# Patient Record
Sex: Male | Born: 1935 | Race: White | Hispanic: No | Marital: Married | Smoking: Former smoker
Health system: Southern US, Community
[De-identification: ages and names within clinical notes are randomized; demographics above are authoritative.]

## PROBLEM LIST (undated history)

## (undated) DIAGNOSIS — I472 Ventricular tachycardia, unspecified: Secondary | ICD-10-CM

## (undated) DIAGNOSIS — Z9109 Other allergy status, other than to drugs and biological substances: Secondary | ICD-10-CM

## (undated) DIAGNOSIS — I4891 Unspecified atrial fibrillation: Secondary | ICD-10-CM

## (undated) DIAGNOSIS — I2510 Atherosclerotic heart disease of native coronary artery without angina pectoris: Secondary | ICD-10-CM

## (undated) DIAGNOSIS — N400 Enlarged prostate without lower urinary tract symptoms: Secondary | ICD-10-CM

## (undated) DIAGNOSIS — K219 Gastro-esophageal reflux disease without esophagitis: Secondary | ICD-10-CM

## (undated) DIAGNOSIS — I10 Essential (primary) hypertension: Secondary | ICD-10-CM

## (undated) DIAGNOSIS — E7800 Pure hypercholesterolemia, unspecified: Secondary | ICD-10-CM

## (undated) HISTORY — PX: APPENDECTOMY: SHX54

## (undated) HISTORY — PX: TONSILLECTOMY: SUR1361

---

## 2012-06-01 ENCOUNTER — Emergency Department (HOSPITAL_BASED_OUTPATIENT_CLINIC_OR_DEPARTMENT_OTHER): Payer: Medicare Other

## 2012-06-01 ENCOUNTER — Encounter (HOSPITAL_BASED_OUTPATIENT_CLINIC_OR_DEPARTMENT_OTHER): Payer: Self-pay | Admitting: *Deleted

## 2012-06-01 ENCOUNTER — Emergency Department (HOSPITAL_BASED_OUTPATIENT_CLINIC_OR_DEPARTMENT_OTHER)
Admission: EM | Admit: 2012-06-01 | Discharge: 2012-06-01 | Disposition: A | Discharge status: Other Acute Inpatient Hospital | Payer: Medicare Other | Attending: Emergency Medicine | Admitting: Emergency Medicine

## 2012-06-01 DIAGNOSIS — Z9109 Other allergy status, other than to drugs and biological substances: Secondary | ICD-10-CM | POA: Insufficient documentation

## 2012-06-01 DIAGNOSIS — Z862 Personal history of diseases of the blood and blood-forming organs and certain disorders involving the immune mechanism: Secondary | ICD-10-CM | POA: Insufficient documentation

## 2012-06-01 DIAGNOSIS — R509 Fever, unspecified: Secondary | ICD-10-CM | POA: Insufficient documentation

## 2012-06-01 DIAGNOSIS — Z8639 Personal history of other endocrine, nutritional and metabolic disease: Secondary | ICD-10-CM | POA: Insufficient documentation

## 2012-06-01 DIAGNOSIS — J189 Pneumonia, unspecified organism: Secondary | ICD-10-CM | POA: Insufficient documentation

## 2012-06-01 DIAGNOSIS — I10 Essential (primary) hypertension: Secondary | ICD-10-CM | POA: Insufficient documentation

## 2012-06-01 DIAGNOSIS — Z87891 Personal history of nicotine dependence: Secondary | ICD-10-CM | POA: Insufficient documentation

## 2012-06-01 HISTORY — DX: Other allergy status, other than to drugs and biological substances: Z91.09

## 2012-06-01 HISTORY — DX: Essential (primary) hypertension: I10

## 2012-06-01 HISTORY — DX: Pure hypercholesterolemia, unspecified: E78.00

## 2012-06-01 LAB — CBC WITH DIFFERENTIAL/PLATELET
Eosinophils Relative: 0 % (ref 0–5)
HCT: 47.9 % (ref 39.0–52.0)
Lymphocytes Relative: 15 % (ref 12–46)
Lymphs Abs: 1.9 10*3/uL (ref 0.7–4.0)
MCH: 31.8 pg (ref 26.0–34.0)
MCV: 93 fL (ref 78.0–100.0)
Monocytes Absolute: 1.4 10*3/uL — ABNORMAL HIGH (ref 0.1–1.0)
RBC: 5.15 MIL/uL (ref 4.22–5.81)
WBC: 13.2 10*3/uL — ABNORMAL HIGH (ref 4.0–10.5)

## 2012-06-01 LAB — BASIC METABOLIC PANEL
BUN: 16 mg/dL (ref 6–23)
CO2: 24 mEq/L (ref 19–32)
Calcium: 9.3 mg/dL (ref 8.4–10.5)
Creatinine, Ser: 1.2 mg/dL (ref 0.50–1.35)
Glucose, Bld: 108 mg/dL — ABNORMAL HIGH (ref 70–99)

## 2012-06-01 LAB — POCT I-STAT 3, ART BLOOD GAS (G3+)
O2 Saturation: 90 %
Patient temperature: 37
pO2, Arterial: 53 mmHg — ABNORMAL LOW (ref 80.0–100.0)

## 2012-06-01 MED ORDER — LEVOFLOXACIN IN D5W 750 MG/150ML IV SOLN
750.0000 mg | Freq: Once | INTRAVENOUS | Status: AC
  Administered 2012-06-01: 750 mg via INTRAVENOUS
  Filled 2012-06-01 (×2): qty 150

## 2012-06-01 MED ORDER — ALBUTEROL SULFATE (5 MG/ML) 0.5% IN NEBU
2.5000 mg | INHALATION_SOLUTION | Freq: Once | RESPIRATORY_TRACT | Status: AC
  Administered 2012-06-01: 2.5 mg via RESPIRATORY_TRACT
  Filled 2012-06-01: qty 0.5

## 2012-06-01 NOTE — ED Provider Notes (Addendum)
History     CSN: [id number]  Arrival date & time 06/01/12  0610   First MD Initiated Contact with Patient 06/01/12 [phone]      Chief Complaint  Patient presents with  . Cough    (Consider location/radiation/quality/duration/timing/severity/associated sxs/prior treatment) HPI Comments: Patient comes to the ER for evaluation of cough. Patient reports that he has had progressively worsening cough for several weeks. Cough is productive of thick sputum. Last night he had some blood in his sputum. Patient reports fever and chills. He saw his Dr. for this and was placed on prednisone, is on his last pill but has not improved. He has not been on any antibiotics.  Patient is a 79 y.o. male presenting with cough.  Cough Associated symptoms: fever     Past Medical History  Diagnosis Date  . Environmental allergies   . High cholesterol   . Hypertension     Past Surgical History  Procedure Laterality Date  . Appendectomy      No family history on file.  History  Substance Use Topics  . Smoking status: Former Games developer  . Smokeless tobacco: Never Used  . Alcohol Use: No      Review of Systems  Constitutional: Positive for fever.  Respiratory: Positive for cough.   All other systems reviewed and are negative.    Allergies  Ivp dye; Pravastatin; and Zocor  Home Medications  No current outpatient prescriptions on file.  BP 132/80  Pulse 88  Temp(Src) 98.1 F (36.7 C) (Oral)  Resp 20  Wt 165 lb (74.844 kg)  SpO2 96%  Physical Exam  Constitutional: He is oriented to person, place, and time. He appears well-developed and well-nourished. No distress.  HENT:  Head: Normocephalic and atraumatic.  Right Ear: Hearing normal.  Nose: Nose normal.  Mouth/Throat: Oropharynx is clear and moist and mucous membranes are normal.  Eyes: Conjunctivae and EOM are normal. Pupils are equal, round, and reactive to light.  Neck: Normal range of motion. Neck supple.  Cardiovascular:  Normal rate, regular rhythm, S1 normal and S2 normal.  Exam reveals no gallop and no friction rub.   No murmur heard. Pulmonary/Chest: Effort normal. No respiratory distress. He has decreased breath sounds in the right middle field, the right lower field, the left middle field and the left lower field. He has wheezes in the right lower field and the left lower field. He has no rhonchi. He has no rales. He exhibits no tenderness.  Abdominal: Soft. Normal appearance and bowel sounds are normal. There is no hepatosplenomegaly. There is no tenderness. There is no rebound, no guarding, no tenderness at McBurney's point and negative Murphy's sign. No hernia.  Musculoskeletal: Normal range of motion.  Neurological: He is alert and oriented to person, place, and time. He has normal strength. No cranial nerve deficit or sensory deficit. Coordination normal. GCS eye subscore is 4. GCS verbal subscore is 5. GCS motor subscore is 6.  Skin: Skin is warm, dry and intact. No rash noted. No cyanosis.  Psychiatric: He has a normal mood and affect. His speech is normal and behavior is normal. Thought content normal.    ED Course  Procedures (including critical care time)  Labs Reviewed - No data to display Dg Chest 2 View  06/01/2012  *RADIOLOGY REPORT*  Clinical Data: Productive cough for 2 days; shortness of breath.  CHEST - 2 VIEW  Comparison: None.  Findings: The lungs are well-aerated.  Bibasilar airspace opacities, more prominent on the right,  raise concern for mild pneumonia.  Mild peribronchial thickening is seen.  There is no evidence of pleural effusion or pneumothorax.  The heart is normal in size; the mediastinal contour is within normal limits.  No acute osseous abnormalities are seen.  IMPRESSION: Bibasilar airspace opacities, more prominent on the right, raise concern for mild pneumonia.  Mild peribronchial thickening seen.   Original Report Authenticated By: Tonia Ghent, M.D.      Diagnosis:  Community Acquired Pneumonia    MDM  Patient has been sick for several weeks with cough. He reports that his symptoms have been progressively worsening. This morning he was more short of breath and he has been in the past so he came to the ER. Patient reports productive cough with thick sputum occasionally bloody. This and has significantly diminished breath sounds at all lung fields, some wheezing at the bases. Oxygenation, however, is 96% on room air with normal respiration rate. Patient's vital signs are all entirely normal. Chest x-ray was performed. Findings consistent with community acquired pneumonia.  Patient administered IV Levaquin. He was given an albuterol nebulizer treatment after which time his oxygen saturation dropped to 90-91%. The patient's oxygenation has not improved. He is requiring supplemental oxygen. He has not any significant respiratory distress, however. Patient will require inpatient hospitalization for further treatment of bilateral community acquired pneumonia. Patient wishes to be admitted to [facility].      Gilda Crease, MD 06/01/12 1610  Gilda Crease, MD 06/01/12 [phone]

## 2012-06-01 NOTE — ED Notes (Signed)
rn supervisor calls back to state that pt now has a room, #756, and that their transport truck is in [place]. Melissa, unit clerk will call [facility] to request transport. Pt and family updated on plan of care.

## 2012-06-01 NOTE — ED Notes (Addendum)
Pt reports productive cough x 2 weeks- saw PCP on 4/4 and was started on prednisone taper pack- also took mucinex D and a hydrocodone (not his Rx) last night

## 2012-06-01 NOTE — ED Notes (Signed)
This rn speaks with rn supervisor at [facility] for update on admit status. Per supervisor, no beds available. States she will call admitting md and see if pt can be admitted to a medical bed. She will call me back.

## 2012-06-01 NOTE — ED Notes (Signed)
ABG done, right radial artery draw. + Allen's test noted. Patient tolerated well. Pressure held 5 minutes to puncture site.

## 2012-06-01 NOTE — ED Notes (Signed)
o2 increased to 4lpm via n/c by steve , rt based on abg results

## 2012-06-07 LAB — CULTURE, BLOOD (ROUTINE X 2): Culture: NO GROWTH

## 2013-08-31 IMAGING — CR DG CHEST 2V
2 series · 2 of 2 positions shown · non-contrast
Comparison: None.

CLINICAL DATA: Productive cough for 2 days; shortness of breath.

CHEST - 2 VIEW

[w chest pa]
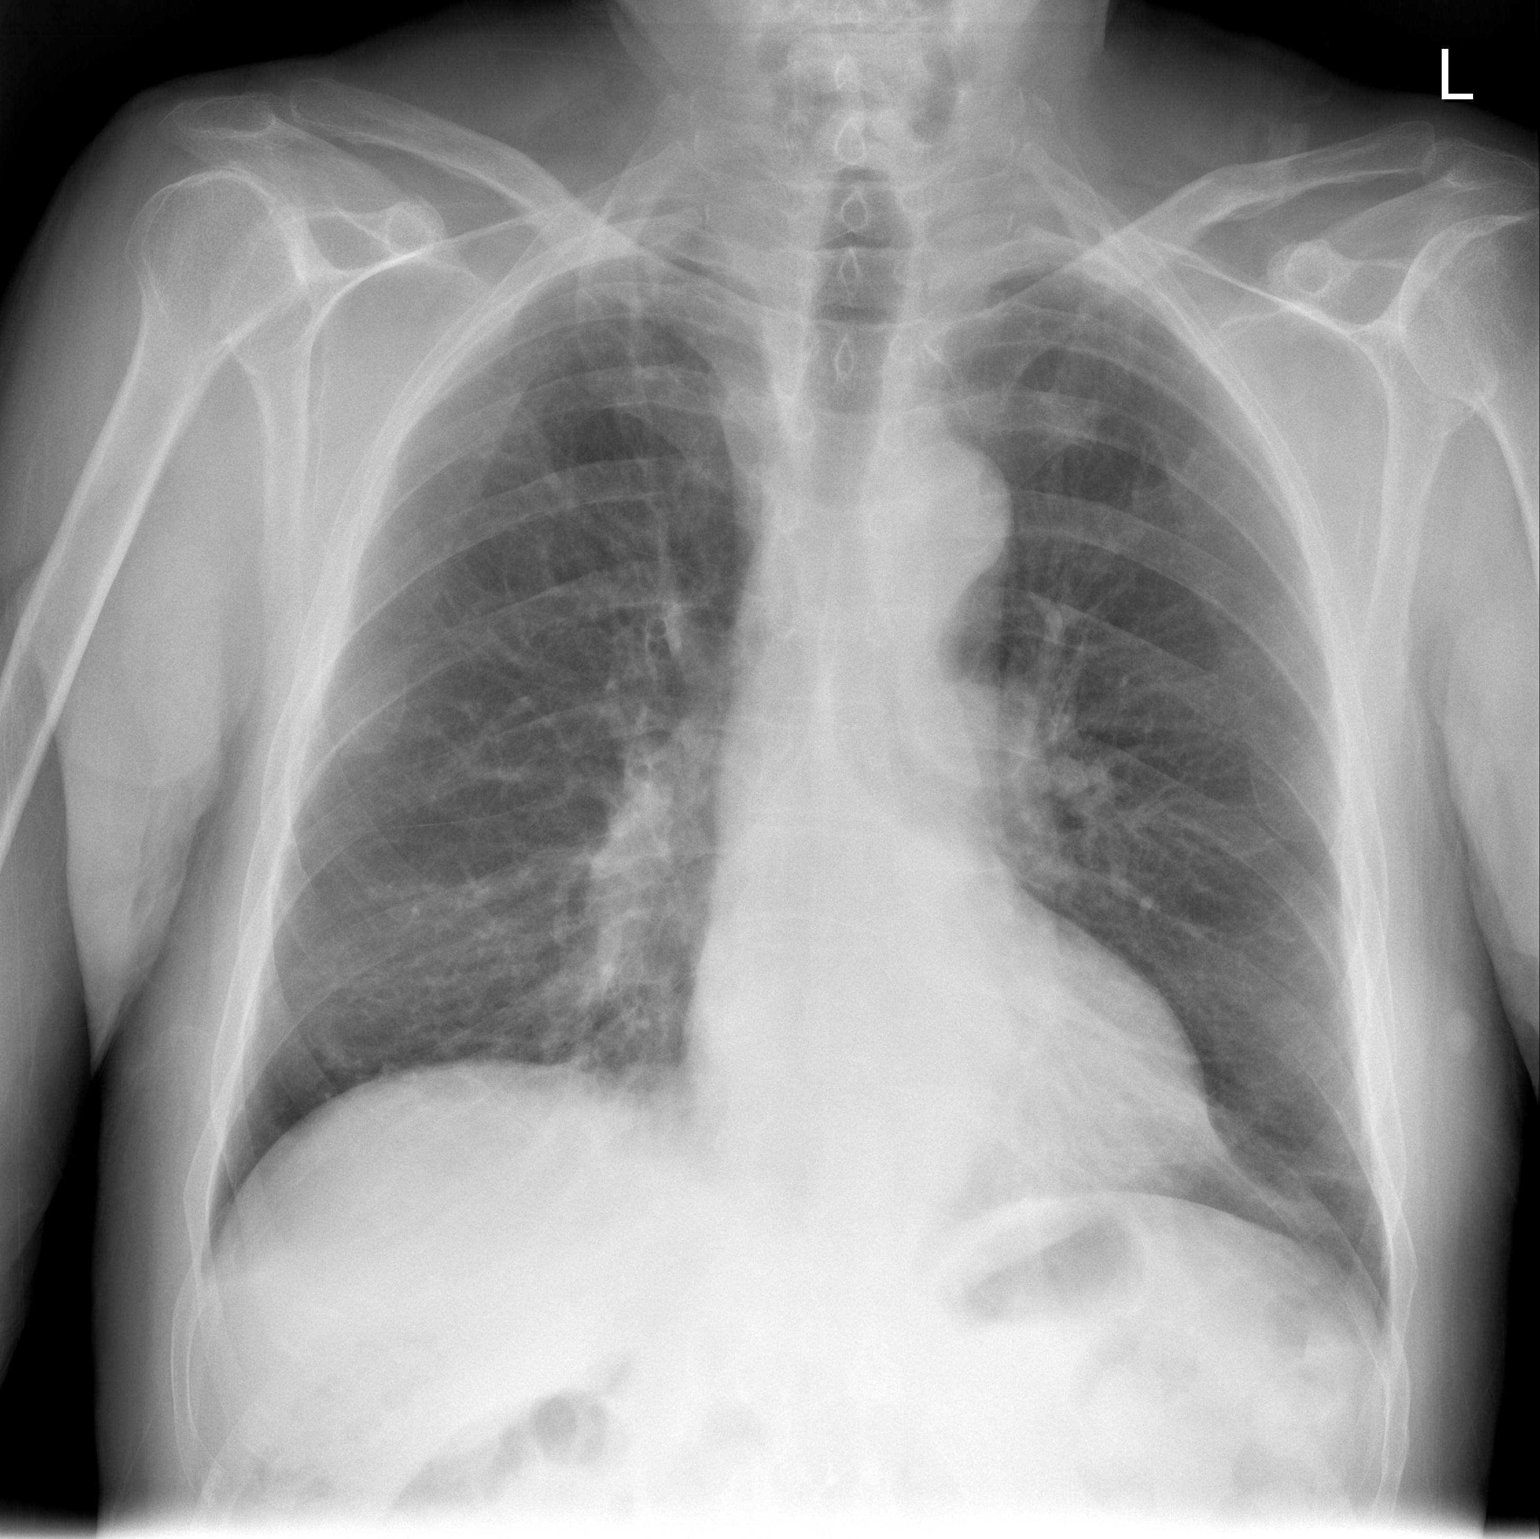

[w chest lat]
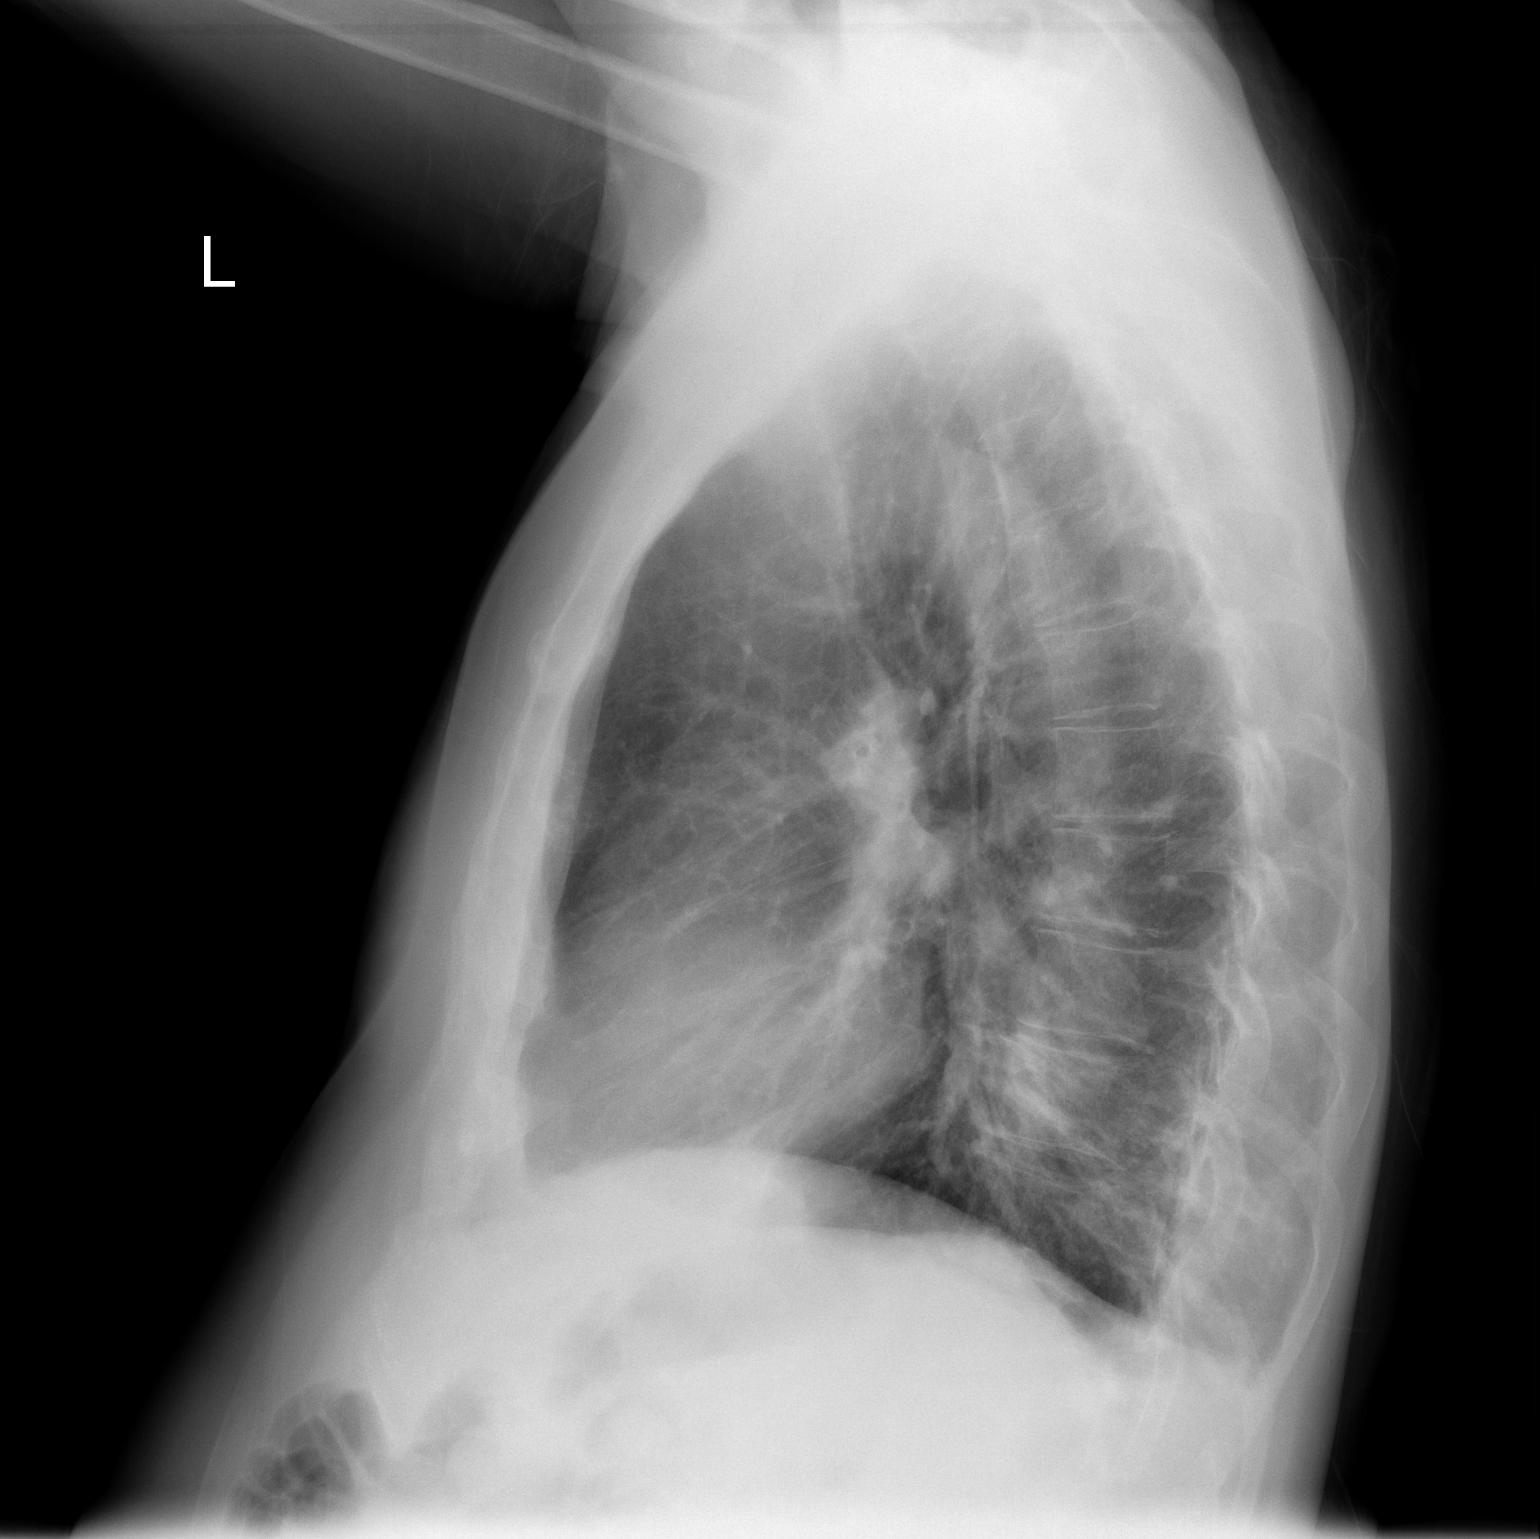

[2 of 2 positions shown; findings below may reference images not displayed]

FINDINGS: The lungs are well-aerated.  Bibasilar airspace
opacities, more prominent on the right, raise concern for mild
pneumonia.  Mild peribronchial thickening is seen.  There is no
evidence of pleural effusion or pneumothorax.

The heart is normal in size; the mediastinal contour is within
normal limits.  No acute osseous abnormalities are seen.
IMPRESSION: Bibasilar airspace opacities, more prominent on the right, raise
concern for mild pneumonia.  Mild peribronchial thickening seen.

## 2015-03-04 DIAGNOSIS — I10 Essential (primary) hypertension: Secondary | ICD-10-CM | POA: Insufficient documentation

## 2015-03-04 DIAGNOSIS — I480 Paroxysmal atrial fibrillation: Secondary | ICD-10-CM | POA: Diagnosis present

## 2015-05-05 DIAGNOSIS — J9601 Acute respiratory failure with hypoxia: Secondary | ICD-10-CM | POA: Diagnosis present

## 2015-05-05 DIAGNOSIS — J189 Pneumonia, unspecified organism: Secondary | ICD-10-CM | POA: Diagnosis present

## 2017-08-05 DIAGNOSIS — J431 Panlobular emphysema: Secondary | ICD-10-CM | POA: Diagnosis present

## 2018-07-31 ENCOUNTER — Encounter (HOSPITAL_BASED_OUTPATIENT_CLINIC_OR_DEPARTMENT_OTHER): Payer: Self-pay | Admitting: Emergency Medicine

## 2018-07-31 ENCOUNTER — Emergency Department (HOSPITAL_BASED_OUTPATIENT_CLINIC_OR_DEPARTMENT_OTHER)
Admission: EM | Admit: 2018-07-31 | Discharge: 2018-07-31 | Disposition: A | Discharge status: Home / Self Care | Payer: Medicare Other | Attending: Emergency Medicine | Admitting: Emergency Medicine

## 2018-07-31 ENCOUNTER — Emergency Department (HOSPITAL_BASED_OUTPATIENT_CLINIC_OR_DEPARTMENT_OTHER): Payer: Medicare Other

## 2018-07-31 ENCOUNTER — Other Ambulatory Visit: Payer: Self-pay

## 2018-07-31 DIAGNOSIS — K219 Gastro-esophageal reflux disease without esophagitis: Secondary | ICD-10-CM | POA: Diagnosis not present

## 2018-07-31 DIAGNOSIS — R05 Cough: Secondary | ICD-10-CM | POA: Diagnosis not present

## 2018-07-31 DIAGNOSIS — Z87891 Personal history of nicotine dependence: Secondary | ICD-10-CM | POA: Insufficient documentation

## 2018-07-31 DIAGNOSIS — R059 Cough, unspecified: Secondary | ICD-10-CM

## 2018-07-31 DIAGNOSIS — Z79899 Other long term (current) drug therapy: Secondary | ICD-10-CM | POA: Insufficient documentation

## 2018-07-31 DIAGNOSIS — I2510 Atherosclerotic heart disease of native coronary artery without angina pectoris: Secondary | ICD-10-CM | POA: Diagnosis not present

## 2018-07-31 DIAGNOSIS — I10 Essential (primary) hypertension: Secondary | ICD-10-CM | POA: Diagnosis not present

## 2018-07-31 DIAGNOSIS — J029 Acute pharyngitis, unspecified: Secondary | ICD-10-CM | POA: Diagnosis present

## 2018-07-31 HISTORY — DX: Benign prostatic hyperplasia without lower urinary tract symptoms: N40.0

## 2018-07-31 HISTORY — DX: Gastro-esophageal reflux disease without esophagitis: K21.9

## 2018-07-31 HISTORY — DX: Atherosclerotic heart disease of native coronary artery without angina pectoris: I25.10

## 2018-07-31 HISTORY — DX: Ventricular tachycardia, unspecified: I47.20

## 2018-07-31 HISTORY — DX: Unspecified atrial fibrillation: I48.91

## 2018-07-31 HISTORY — DX: Ventricular tachycardia: I47.2

## 2018-07-31 MED ORDER — LIDOCAINE VISCOUS HCL 2 % MT SOLN
15.0000 mL | Freq: Once | OROMUCOSAL | Status: AC
  Administered 2018-07-31: 15 mL via ORAL
  Filled 2018-07-31: qty 15

## 2018-07-31 MED ORDER — ALUM & MAG HYDROXIDE-SIMETH 200-200-20 MG/5ML PO SUSP
30.0000 mL | Freq: Once | ORAL | Status: AC
  Administered 2018-07-31: 30 mL via ORAL
  Filled 2018-07-31: qty 30

## 2018-07-31 MED ORDER — METOPROLOL TARTRATE 12.5 MG HALF TABLET
12.5000 mg | ORAL_TABLET | Freq: Once | ORAL | Status: AC
  Administered 2018-07-31: 12.5 mg via ORAL
  Filled 2018-07-31: qty 1

## 2018-07-31 MED ORDER — METOPROLOL TARTRATE 50 MG PO TABS
ORAL_TABLET | ORAL | Status: AC
  Filled 2018-07-31: qty 1

## 2018-07-31 NOTE — ED Provider Notes (Signed)
[facility] EMERGENCY DEPARTMENT Provider Note   CSN: [id number] Arrival date & time: 07/31/18  1341    History   Chief Complaint Chief Complaint  Patient presents with  . Cough  . Sore Throat    HPI Jonathan Mathews is a 85 y.o. male.     HPI Patient with history of gastroesophageal reflux disease.  States he ate hamburgers and hotdogs yesterday evening and then woke up with burning sensation in the back of his throat and dry nonproductive cough.  Denies chest pain or shortness of breath.  States he occasionally gets epigastric discomfort due to reflux disease.  No fever or chills.  No known coronavirus exposure. Past Medical History:  Diagnosis Date  . Atrial fibrillation (HCC)   . BPH (benign prostatic hyperplasia)   . Coronary artery disease   . Environmental allergies   . GERD (gastroesophageal reflux disease)   . High cholesterol   . Hypertension   . Ventricular tachycardia (HCC)     There are no active problems to display for this patient.   Past Surgical History:  Procedure Laterality Date  . APPENDECTOMY    . TONSILLECTOMY          Home Medications    Prior to Admission medications   Medication Sig Start Date End Date Taking? Authorizing Provider  albuterol (PROVENTIL) (2.5 MG/3ML) 0.083% nebulizer solution Inhale into the lungs. 03/11/18  Yes [provider]  albuterol (VENTOLIN HFA) 108 (90 Base) MCG/ACT inhaler Inhale into the lungs. 02/10/17  Yes [provider]  ALPRAZolam Prudy Feeler(XANAX) 0.5 MG tablet Take 0.5 mg by mouth 3 (three) times daily as needed for sleep.   Yes [provider]  dabigatran (PRADAXA) 150 MG CAPS capsule TAKE 1 CAPSULE TWICE A DAY 10/15/16  Yes [provider]  fluticasone (FLONASE) 50 MCG/ACT nasal spray USE 2 SPRAYS NASALLY DAILY 02/19/18  Yes [provider]  furosemide (LASIX) 40 MG tablet TAKE 1 TABLET DAILY AS NEEDED FOR SWELLING 11/04/17  Yes [provider]   ketoconazole (NIZORAL) 2 % cream APPLY EXTERNALLY TO THE AFFECTED AREA TWICE DAILY FOR RASH 06/14/18  Yes [provider]  omeprazole (PRILOSEC) 40 MG capsule TAKE 1 CAPSULE DAILY 04/15/18  Yes [provider]  potassium chloride (K-DUR) 10 MEQ tablet Take by mouth. 12/14/17  Yes [provider]  rosuvastatin (CRESTOR) 5 MG tablet TAKE 1 TABLET DAILY 11/29/17  Yes [provider]  sotalol (BETAPACE) 80 MG tablet TAKE ONE-HALF (1/2) TABLET TWICE A DAY 09/03/15  Yes [provider]  azelastine (ASTELIN) 137 MCG/SPRAY nasal spray Place 1 spray into the nose 2 (two) times daily. Use in each nostril as directed    [provider]  fexofenadine (ALLEGRA) 60 MG tablet Take 60 mg by mouth 2 (two) times daily.    [provider]  fish oil-omega-3 fatty acids 1000 MG capsule Take 2 g by mouth daily.    [provider]  furosemide (LASIX) 40 MG tablet Take 20 mg by mouth daily.    [provider]  irbesartan (AVAPRO) 300 MG tablet  07/20/18   [provider]  ketoconazole (NIZORAL) 2 % cream Apply topically daily.    [provider]  loratadine (CLARITIN) 10 MG tablet Take by mouth.    [provider]  metoprolol succinate (TOPROL-XL) 25 MG 24 hr tablet Take 25 mg by mouth daily.    [provider]  montelukast (SINGULAIR) 10 MG tablet  07/14/18  [provider]  Omega-3 1000 MG CAPS Take by mouth.    [provider]  omeprazole (PRILOSEC) 40 MG capsule Take 40 mg by mouth daily.    [provider]  polyethylene glycol powder (GLYCOLAX/MIRALAX) 17 GM/SCOOP powder  06/14/18   [provider]  predniSONE (STERAPRED UNI-PAK) 10 MG tablet Take 10 mg by mouth daily. Taper- on last day    [provider]  Pseudoephedrine-Guaifenesin (MUCINEX D PO) Take by mouth.    [provider]  rosuvastatin (CRESTOR) 5 MG tablet Take 5 mg by mouth daily.     [provider]    Family History No family history on file.  Social History Social History   Tobacco Use  . Smoking status: Former Research scientist (life sciences)  . Smokeless tobacco: Never Used  Substance Use Topics  . Alcohol use: No  . Drug use: No     Allergies   Clindamycin/lincomycin; Doxycycline; Ivp dye [iodinated diagnostic agents]; Pravastatin; and Zocor [simvastatin]   Review of Systems Review of Systems  Constitutional: Negative for chills and fever.  HENT: Positive for sore throat. Negative for trouble swallowing.   Respiratory: Positive for cough. Negative for shortness of breath.   Gastrointestinal: Negative for abdominal pain, constipation, diarrhea, nausea and vomiting.  Musculoskeletal: Negative for back pain, myalgias and neck pain.  Skin: Negative for rash and wound.  Neurological: Negative for dizziness, weakness, light-headedness, numbness and headaches.  All other systems reviewed and are negative.    Physical Exam Updated Vital Signs BP (!) 190/100   Pulse 78   Temp 98.3 F (36.8 C)   Resp 18   Ht 5\' 7"  (1.702 m)   Wt 77.6 kg   SpO2 98%   BMI 26.78 kg/m   Physical Exam Vitals signs and nursing note reviewed.  Constitutional:      Appearance: Normal appearance. He is well-developed.  HENT:     Head: Normocephalic and atraumatic.     Mouth/Throat:     Pharynx: Posterior oropharyngeal erythema present.     Comments: Posterior oropharynx is mildly erythematous.  No tonsillar exudates. Eyes:     Pupils: Pupils are equal, round, and reactive to light.  Neck:     Musculoskeletal: Normal range of motion and neck supple. No neck rigidity or muscular tenderness.  Cardiovascular:     Rate and Rhythm: Normal rate and regular rhythm.     Heart sounds: No murmur. No friction rub. No gallop.   Pulmonary:     Effort: Pulmonary effort is normal. No respiratory distress.     Breath sounds: Normal breath sounds. No stridor. No wheezing, rhonchi or rales.   Chest:     Chest wall: No tenderness.  Abdominal:     General: Bowel sounds are normal.     Palpations: Abdomen is soft.     Tenderness: There is no abdominal tenderness. There is no guarding or rebound.  Musculoskeletal: Normal range of motion.        General: No swelling, tenderness, deformity or signs of injury.     Right lower leg: No edema.     Left lower leg: No edema.  Lymphadenopathy:     Cervical: No cervical adenopathy.  Skin:    General: Skin is warm and dry.     Findings: No erythema or rash.  Neurological:     General: No focal deficit present.     Mental Status: He is alert and oriented to person, place, and time.  Psychiatric:  Behavior: Behavior normal.      ED Treatments / Results  Labs (all labs ordered are listed, but only abnormal results are displayed) Labs Reviewed - No data to display  EKG None  Radiology Dg Chest 2 View  Result Date: 07/31/2018 CLINICAL DATA:  Cough and sore throat. Ex-smoker. COPD. EXAM: CHEST - 2 VIEW COMPARISON:  06/21/2018 FINDINGS: Midline trachea. Normal heart size. Atherosclerosis in the transverse aorta. No pleural effusion or pneumothorax. Bibasilar scarring. IMPRESSION: No acute cardiopulmonary disease. Electronically Signed   By: Jeronimo GreavesKyle  Talbot M.D.   On: 07/31/2018 14:46    Procedures Procedures (including critical care time)  Medications Ordered in ED Medications  alum & mag hydroxide-simeth (MAALOX/MYLANTA) 200-200-20 MG/5ML suspension 30 mL (30 mLs Oral Given 07/31/18 1535)    And  lidocaine (XYLOCAINE) 2 % viscous mouth solution 15 mL (15 mLs Oral Given 07/31/18 1535)  metoprolol tartrate (LOPRESSOR) tablet 12.5 mg (12.5 mg Oral Given 07/31/18 1608)     Initial Impression / Assessment and Plan / ED Course  I have reviewed the triage vital signs and the nursing notes.  Pertinent labs & imaging results that were available during my care of the patient were reviewed by me and considered in my medical decision  making (see chart for details).        Suspect symptoms are related to gastroesophageal reflux disease and small amount of aspiration.  Chest x-ray without acute findings.  Maintaining oxygen saturations in the high 90s on room air.  No respiratory distress.  Will give GI cocktail. Some improvement of sore throat.  No respiratory distress.  Blood pressures improved with small dose of metoprolol.  Advised to follow-up with his primary physician and return precautions given. Final Clinical Impressions(s) / ED Diagnoses   Final diagnoses:  Gastroesophageal reflux disease, esophagitis presence not specified  Cough    ED Discharge Orders    None       Loren RacerYelverton, Krikor Willet, MD 07/31/18 [phone]

## 2018-07-31 NOTE — ED Notes (Addendum)
Notified RN regarding bp 186/98

## 2018-07-31 NOTE — ED Notes (Signed)
ED Provider at bedside. 

## 2018-07-31 NOTE — ED Triage Notes (Signed)
Cough and sorethroat since yesterday.   

## 2019-10-30 IMAGING — CR CHEST - 2 VIEW
2 series · 2 of 2 positions shown · non-contrast
Comparison: 06/21/2018

CLINICAL DATA: Cough and sore throat. Ex-smoker. COPD.

EXAM:
CHEST - 2 VIEW

[w chest pa]
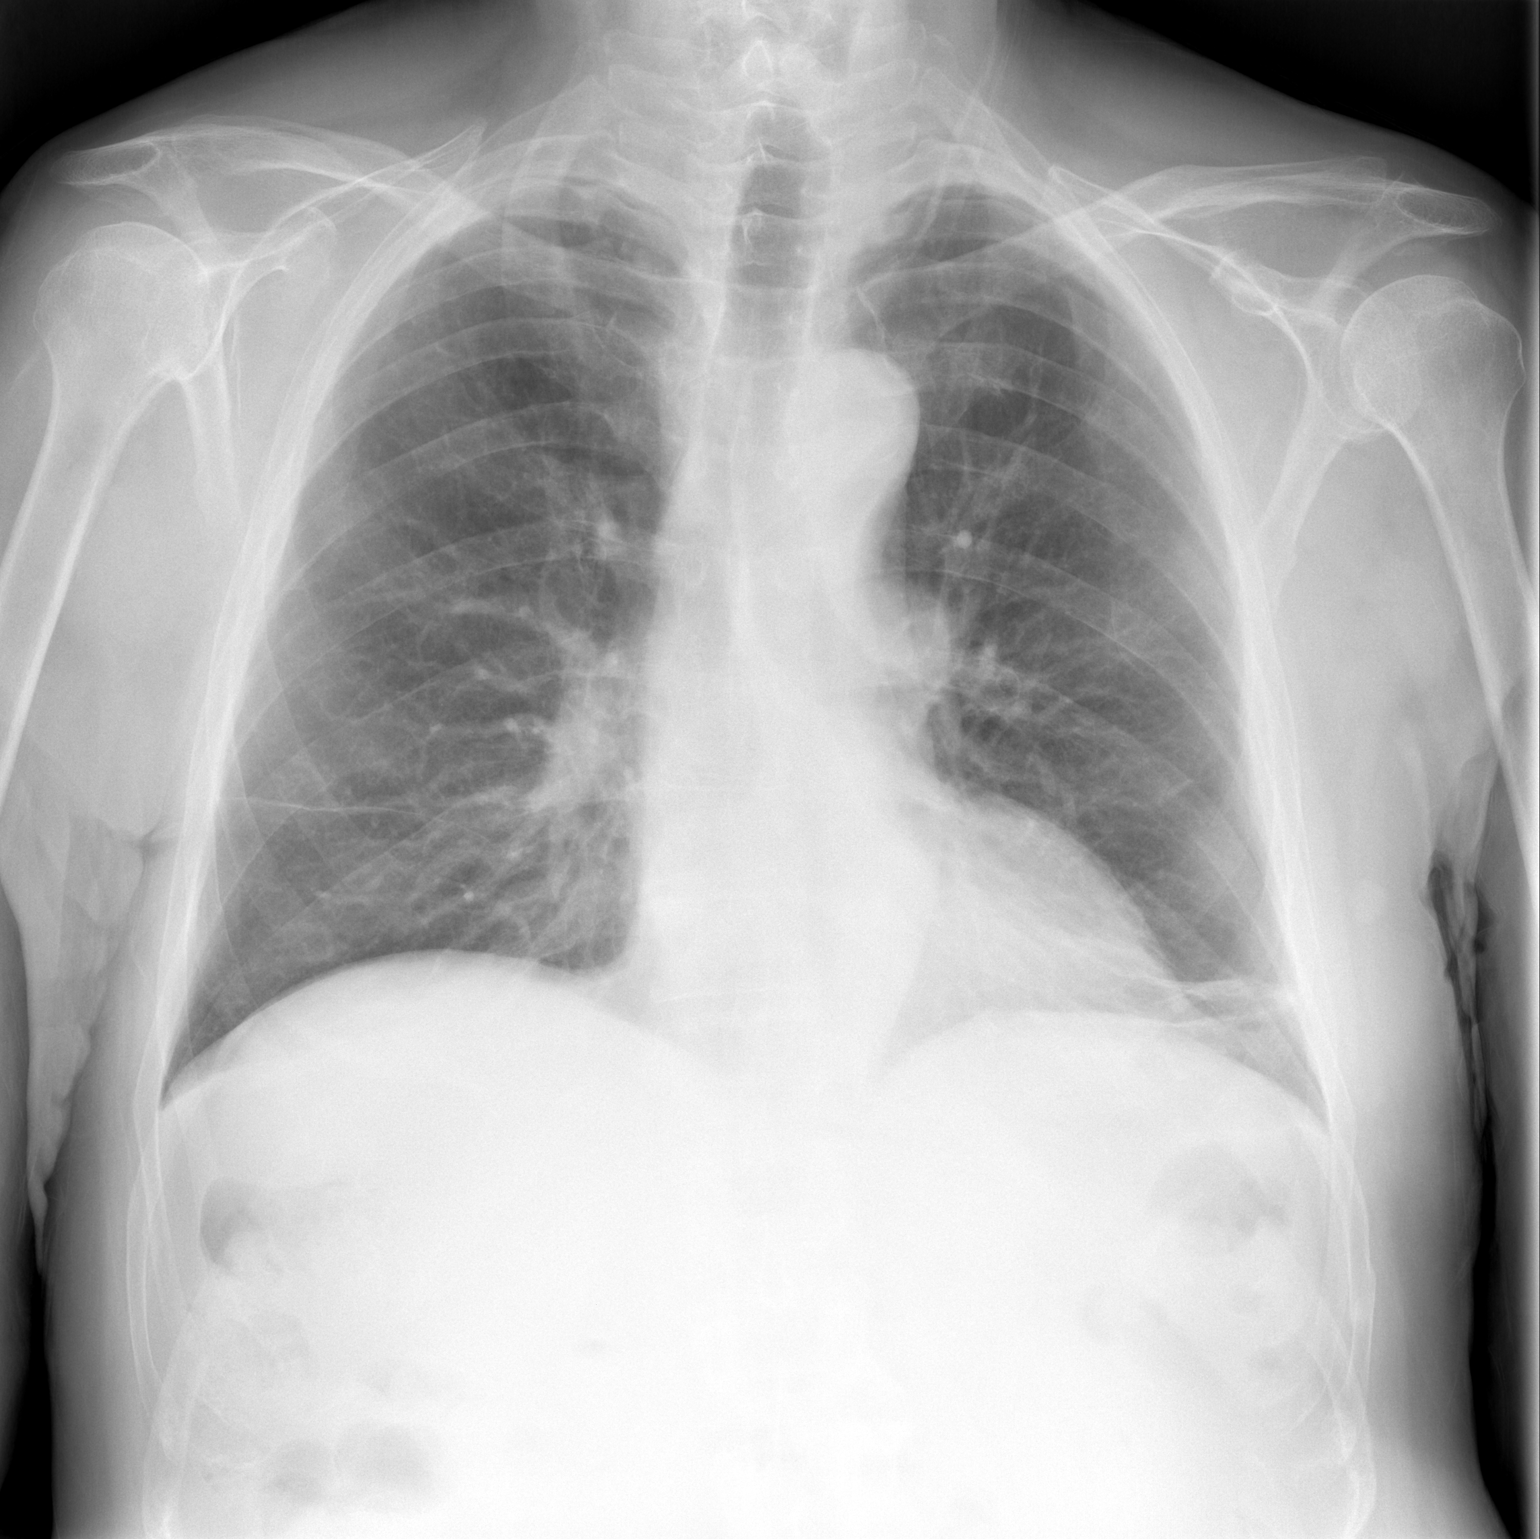

[w chest lat]
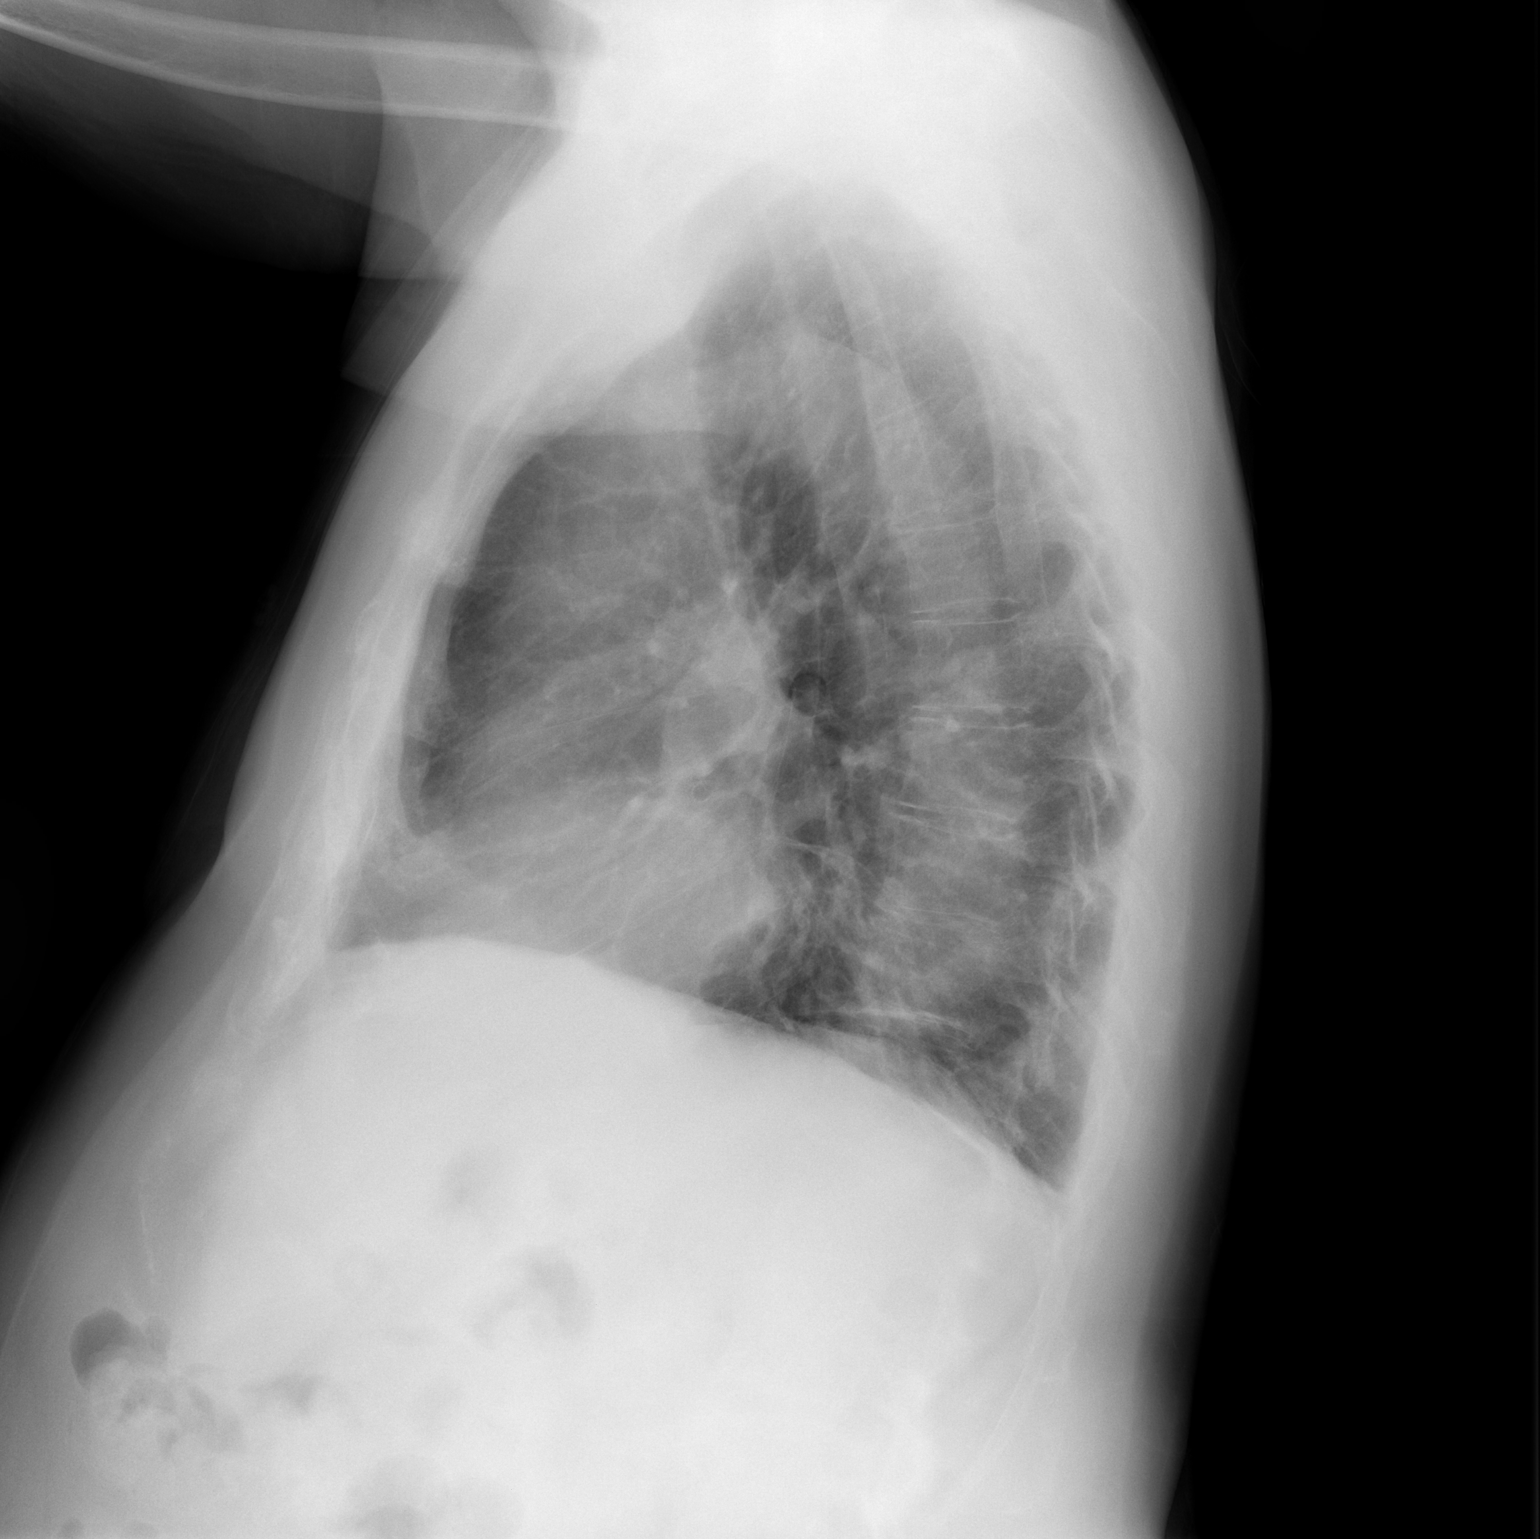

[2 of 2 positions shown; findings below may reference images not displayed]

FINDINGS: Midline trachea. Normal heart size. Atherosclerosis in the
transverse aorta. No pleural effusion or pneumothorax. Bibasilar
scarring.
IMPRESSION: No acute cardiopulmonary disease.

## 2020-07-16 DIAGNOSIS — J449 Chronic obstructive pulmonary disease, unspecified: Secondary | ICD-10-CM | POA: Diagnosis present

## 2020-08-30 DIAGNOSIS — I5020 Unspecified systolic (congestive) heart failure: Secondary | ICD-10-CM | POA: Diagnosis present

## 2022-09-24 DIAGNOSIS — N183 Chronic kidney disease, stage 3 unspecified: Secondary | ICD-10-CM | POA: Diagnosis present

## 2023-03-11 ENCOUNTER — Emergency Department (HOSPITAL_COMMUNITY): Payer: Medicare Other

## 2023-03-11 ENCOUNTER — Inpatient Hospital Stay (HOSPITAL_COMMUNITY)
Admission: EM | Admit: 2023-03-11 | Discharge: 2023-03-20 | DRG: 521 | Disposition: A | Discharge status: Hospice | Payer: Medicare Other | Attending: Family Medicine | Admitting: Family Medicine

## 2023-03-11 ENCOUNTER — Other Ambulatory Visit: Payer: Self-pay

## 2023-03-11 ENCOUNTER — Encounter (HOSPITAL_COMMUNITY): Payer: Self-pay

## 2023-03-11 DIAGNOSIS — N1831 Chronic kidney disease, stage 3a: Secondary | ICD-10-CM | POA: Diagnosis present

## 2023-03-11 DIAGNOSIS — S72001A Fracture of unspecified part of neck of right femur, initial encounter for closed fracture: Secondary | ICD-10-CM | POA: Diagnosis not present

## 2023-03-11 DIAGNOSIS — Z7902 Long term (current) use of antithrombotics/antiplatelets: Secondary | ICD-10-CM

## 2023-03-11 DIAGNOSIS — Z91041 Radiographic dye allergy status: Secondary | ICD-10-CM

## 2023-03-11 DIAGNOSIS — Z66 Do not resuscitate: Secondary | ICD-10-CM | POA: Diagnosis not present

## 2023-03-11 DIAGNOSIS — N400 Enlarged prostate without lower urinary tract symptoms: Secondary | ICD-10-CM | POA: Diagnosis present

## 2023-03-11 DIAGNOSIS — Z515 Encounter for palliative care: Secondary | ICD-10-CM

## 2023-03-11 DIAGNOSIS — Z881 Allergy status to other antibiotic agents status: Secondary | ICD-10-CM

## 2023-03-11 DIAGNOSIS — N179 Acute kidney failure, unspecified: Secondary | ICD-10-CM | POA: Diagnosis present

## 2023-03-11 DIAGNOSIS — Z7982 Long term (current) use of aspirin: Secondary | ICD-10-CM

## 2023-03-11 DIAGNOSIS — J449 Chronic obstructive pulmonary disease, unspecified: Secondary | ICD-10-CM | POA: Diagnosis present

## 2023-03-11 DIAGNOSIS — K219 Gastro-esophageal reflux disease without esophagitis: Secondary | ICD-10-CM | POA: Diagnosis present

## 2023-03-11 DIAGNOSIS — E7800 Pure hypercholesterolemia, unspecified: Secondary | ICD-10-CM | POA: Diagnosis present

## 2023-03-11 DIAGNOSIS — I493 Ventricular premature depolarization: Secondary | ICD-10-CM | POA: Diagnosis present

## 2023-03-11 DIAGNOSIS — I472 Ventricular tachycardia, unspecified: Secondary | ICD-10-CM | POA: Diagnosis present

## 2023-03-11 DIAGNOSIS — J189 Pneumonia, unspecified organism: Secondary | ICD-10-CM | POA: Diagnosis present

## 2023-03-11 DIAGNOSIS — Z7951 Long term (current) use of inhaled steroids: Secondary | ICD-10-CM

## 2023-03-11 DIAGNOSIS — W19XXXA Unspecified fall, initial encounter: Principal | ICD-10-CM

## 2023-03-11 DIAGNOSIS — J440 Chronic obstructive pulmonary disease with acute lower respiratory infection: Secondary | ICD-10-CM | POA: Diagnosis present

## 2023-03-11 DIAGNOSIS — R54 Age-related physical debility: Secondary | ICD-10-CM | POA: Diagnosis present

## 2023-03-11 DIAGNOSIS — Z79899 Other long term (current) drug therapy: Secondary | ICD-10-CM

## 2023-03-11 DIAGNOSIS — N183 Chronic kidney disease, stage 3 unspecified: Secondary | ICD-10-CM | POA: Diagnosis present

## 2023-03-11 DIAGNOSIS — S60511A Abrasion of right hand, initial encounter: Secondary | ICD-10-CM | POA: Diagnosis present

## 2023-03-11 DIAGNOSIS — Y838 Other surgical procedures as the cause of abnormal reaction of the patient, or of later complication, without mention of misadventure at the time of the procedure: Secondary | ICD-10-CM | POA: Diagnosis not present

## 2023-03-11 DIAGNOSIS — I5020 Unspecified systolic (congestive) heart failure: Secondary | ICD-10-CM | POA: Diagnosis present

## 2023-03-11 DIAGNOSIS — J9601 Acute respiratory failure with hypoxia: Secondary | ICD-10-CM | POA: Diagnosis not present

## 2023-03-11 DIAGNOSIS — I480 Paroxysmal atrial fibrillation: Secondary | ICD-10-CM | POA: Diagnosis present

## 2023-03-11 DIAGNOSIS — S60512A Abrasion of left hand, initial encounter: Secondary | ICD-10-CM | POA: Diagnosis present

## 2023-03-11 DIAGNOSIS — Z8673 Personal history of transient ischemic attack (TIA), and cerebral infarction without residual deficits: Secondary | ICD-10-CM

## 2023-03-11 DIAGNOSIS — W000XXA Fall on same level due to ice and snow, initial encounter: Secondary | ICD-10-CM | POA: Diagnosis present

## 2023-03-11 DIAGNOSIS — I130 Hypertensive heart and chronic kidney disease with heart failure and stage 1 through stage 4 chronic kidney disease, or unspecified chronic kidney disease: Secondary | ICD-10-CM | POA: Diagnosis present

## 2023-03-11 DIAGNOSIS — Z888 Allergy status to other drugs, medicaments and biological substances status: Secondary | ICD-10-CM

## 2023-03-11 DIAGNOSIS — I428 Other cardiomyopathies: Secondary | ICD-10-CM | POA: Diagnosis present

## 2023-03-11 DIAGNOSIS — I5023 Acute on chronic systolic (congestive) heart failure: Secondary | ICD-10-CM | POA: Diagnosis present

## 2023-03-11 DIAGNOSIS — J690 Pneumonitis due to inhalation of food and vomit: Secondary | ICD-10-CM | POA: Diagnosis not present

## 2023-03-11 DIAGNOSIS — Z1152 Encounter for screening for COVID-19: Secondary | ICD-10-CM

## 2023-03-11 DIAGNOSIS — I2510 Atherosclerotic heart disease of native coronary artery without angina pectoris: Secondary | ICD-10-CM | POA: Diagnosis present

## 2023-03-11 DIAGNOSIS — Z87891 Personal history of nicotine dependence: Secondary | ICD-10-CM

## 2023-03-11 DIAGNOSIS — I471 Supraventricular tachycardia, unspecified: Secondary | ICD-10-CM | POA: Diagnosis present

## 2023-03-11 DIAGNOSIS — J431 Panlobular emphysema: Secondary | ICD-10-CM | POA: Diagnosis present

## 2023-03-11 LAB — CBC
HCT: 43.2 % (ref 39.0–52.0)
Hemoglobin: 14.1 g/dL (ref 13.0–17.0)
MCH: 32.3 pg (ref 26.0–34.0)
MCHC: 32.6 g/dL (ref 30.0–36.0)
MCV: 99.1 fL (ref 80.0–100.0)
Platelets: 269 10*3/uL (ref 150–400)
RBC: 4.36 MIL/uL (ref 4.22–5.81)
RDW: 16.1 % — ABNORMAL HIGH (ref 11.5–15.5)
WBC: 11.6 10*3/uL — ABNORMAL HIGH (ref 4.0–10.5)
nRBC: 0 % (ref 0.0–0.2)

## 2023-03-11 LAB — SAMPLE TO BLOOD BANK

## 2023-03-11 LAB — COMPREHENSIVE METABOLIC PANEL
ALT: 44 U/L (ref 0–44)
AST: 39 U/L (ref 15–41)
Albumin: 3.4 g/dL — ABNORMAL LOW (ref 3.5–5.0)
Alkaline Phosphatase: 80 U/L (ref 38–126)
Anion gap: 8 (ref 5–15)
BUN: 23 mg/dL (ref 8–23)
CO2: 22 mmol/L (ref 22–32)
Calcium: 8.6 mg/dL — ABNORMAL LOW (ref 8.9–10.3)
Chloride: 107 mmol/L (ref 98–111)
Creatinine, Ser: 1.88 mg/dL — ABNORMAL HIGH (ref 0.61–1.24)
GFR, Estimated: 34 mL/min — ABNORMAL LOW (ref >60)
Glucose, Bld: 138 mg/dL — ABNORMAL HIGH (ref 70–99)
Potassium: 3.6 mmol/L (ref 3.5–5.1)
Sodium: 137 mmol/L (ref 135–145)
Total Bilirubin: 1.5 mg/dL — ABNORMAL HIGH (ref 0.0–1.2)
Total Protein: 5.8 g/dL — ABNORMAL LOW (ref 6.5–8.1)

## 2023-03-11 LAB — I-STAT CHEM 8, ED
BUN: 30 mg/dL — ABNORMAL HIGH (ref 8–23)
Calcium, Ion: 1.13 mmol/L — ABNORMAL LOW (ref 1.15–1.40)
Chloride: 106 mmol/L (ref 98–111)
Creatinine, Ser: 2 mg/dL — ABNORMAL HIGH (ref 0.61–1.24)
Glucose, Bld: 135 mg/dL — ABNORMAL HIGH (ref 70–99)
HCT: 44 % (ref 39.0–52.0)
Hemoglobin: 15 g/dL (ref 13.0–17.0)
Potassium: 3.9 mmol/L (ref 3.5–5.1)
Sodium: 141 mmol/L (ref 135–145)
TCO2: 24 mmol/L (ref 22–32)

## 2023-03-11 LAB — PROTIME-INR
INR: 1.1 (ref 0.8–1.2)
Prothrombin Time: 14.4 s (ref 11.4–15.2)

## 2023-03-11 LAB — ETHANOL: Alcohol, Ethyl (B): <10 mg/dL (ref <10)

## 2023-03-11 LAB — TROPONIN I (HIGH SENSITIVITY): Troponin I (High Sensitivity): 15 ng/L (ref <18)

## 2023-03-11 LAB — BRAIN NATRIURETIC PEPTIDE: B Natriuretic Peptide: 185.3 pg/mL — ABNORMAL HIGH (ref 0.0–100.0)

## 2023-03-11 LAB — I-STAT CG4 LACTIC ACID, ED: Lactic Acid, Venous: 1.1 mmol/L (ref 0.5–1.9)

## 2023-03-11 MED ORDER — FUROSEMIDE 10 MG/ML IJ SOLN: 40.0000 mg | Freq: Once | INTRAMUSCULAR | Status: DC

## 2023-03-11 MED ORDER — IPRATROPIUM-ALBUTEROL 0.5-2.5 (3) MG/3ML IN SOLN
3.0000 mL | Freq: Once | RESPIRATORY_TRACT | Status: AC
  Administered 2023-03-11: 3 mL via RESPIRATORY_TRACT
  Filled 2023-03-11: qty 3

## 2023-03-11 MED ORDER — MORPHINE SULFATE (PF) 2 MG/ML IV SOLN
2.0000 mg | Freq: Once | INTRAVENOUS | Status: AC
  Administered 2023-03-11: 2 mg via INTRAVENOUS
  Filled 2023-03-11: qty 1

## 2023-03-11 MED ORDER — ONDANSETRON HCL 4 MG/2ML IJ SOLN
4.0000 mg | Freq: Once | INTRAMUSCULAR | Status: AC
  Administered 2023-03-11: 4 mg via INTRAVENOUS
  Filled 2023-03-11: qty 2

## 2023-03-11 MED ORDER — FENTANYL CITRATE PF 50 MCG/ML IJ SOSY
50.0000 ug | PREFILLED_SYRINGE | Freq: Once | INTRAMUSCULAR | Status: AC
  Administered 2023-03-11: 50 ug via INTRAVENOUS
  Filled 2023-03-11: qty 1

## 2023-03-11 MED ORDER — MORPHINE SULFATE (PF) 4 MG/ML IV SOLN: 4.0000 mg | Freq: Once | INTRAVENOUS | Status: DC

## 2023-03-11 MED ORDER — CEFAZOLIN SODIUM-DEXTROSE 2-4 GM/100ML-% IV SOLN
2.0000 g | Freq: Once | INTRAVENOUS | Status: AC
  Administered 2023-03-11: 2 g via INTRAVENOUS

## 2023-03-11 MED ORDER — SODIUM CHLORIDE 0.9 % IV SOLN
500.0000 mg | Freq: Once | INTRAVENOUS | Status: AC
  Administered 2023-03-12: 500 mg via INTRAVENOUS
  Filled 2023-03-11: qty 5

## 2023-03-11 MED ORDER — METHYLPREDNISOLONE SODIUM SUCC 125 MG IJ SOLR
125.0000 mg | Freq: Once | INTRAMUSCULAR | Status: AC
  Administered 2023-03-11: 125 mg via INTRAVENOUS
  Filled 2023-03-11: qty 2

## 2023-03-11 MED ORDER — SODIUM CHLORIDE 0.9 % IV SOLN
1.0000 g | Freq: Once | INTRAVENOUS | Status: AC
  Administered 2023-03-11: 1 g via INTRAVENOUS
  Filled 2023-03-11: qty 10

## 2023-03-11 MED ORDER — SODIUM CHLORIDE 0.9 % IV BOLUS
1000.0000 mL | Freq: Once | INTRAVENOUS | Status: AC
  Administered 2023-03-11: 1000 mL via INTRAVENOUS

## 2023-03-11 NOTE — ED Triage Notes (Addendum)
Pt BIB [facility] from home. He was at home when he slipped and fell on the ice. Pt reports falling on right side, no LOC. On Plavix. EMS reports obvious deformity to right hip. EMS also reports shorting of right leg. 3 skin tears noted on left hand, 2 skin tears noted on right hand. In C-collar upon arrival. A&Ox4, GCS 15  EMS  182/100 BP 80s O2 6LNC 15 RFA 200 mcg fentanyl

## 2023-03-11 NOTE — ED Notes (Addendum)
Trauma Response Nurse Documentation   Jonathan Mathews is a 89 y.o. male arriving to [facility] ED via EMS  On clopidogrel 75 mg daily. Trauma was activated as a Level 2 by primary RN based on the following trauma criteria Elderly patients > 65 with head trauma on anti-coagulation (excluding ASA).  Patient cleared for CT by Dr. Wallace Cullens EDP. Pt transported to CT with trauma response nurse present to monitor. RN remained with the patient throughout their absence from the department for clinical observation.   GCS 15.   History   Past Medical History:  Diagnosis Date   Atrial fibrillation (HCC)    BPH (benign prostatic hyperplasia)    Coronary artery disease    Environmental allergies    GERD (gastroesophageal reflux disease)    High cholesterol    Hypertension    Ventricular tachycardia (HCC)      Past Surgical History:  Procedure Laterality Date   APPENDECTOMY     TONSILLECTOMY         Initial Focused Assessment (If applicable, or please see trauma documentation): Alert/oriented male presents via EMS from home after a ground level fall on ice. Obvious right hip deformity with shortening and rotation noted. CMS intact. Skin tears to bilateral hands, abrasion to right knee.  Airway patent, unobstructed BS clear hypoxia noted Bleeding to skin tears to bilateral hands controlled with guaze GCS15 PERRLA 2  CT's Completed:   CT Head, CT C-Spine, CT Chest w/ contrast, and CT abdomen/pelvis w/ contrast  CT hip R Interventions:  IV start and trauma lab draw Portable chest, pelvis and right femur XRAY CT head, c-spine, chest/abd/pelvis, right hip EKG ANCEF Fentanyl for pain control NS bolus Respiratory panel Family presence/updates CAGEAID Plan for disposition:  Admission to floor   Consults completed:  Orthopaedic Surgeon paged at [phone], call returned [phone]  Event Summary: Presents via EMS from home after a fall on ice, landing on right side. Right hip deformity, bilateral  hands skin tears, abrasion right knee. Hypoxia requiring nonrebreather with sats up to 91% on 15 L. Placed on hospital bed following CT.  MTP Summary (If applicable): NA  Bedside handoff with ED RN Porfirio Mylar.    Cire Deyarmin O Mishelle Hassan  Trauma Response RN  Please call TRN at [phone] for further assistance.

## 2023-03-11 NOTE — Progress Notes (Signed)
Transition of Care [facility]) - CAGE-AID Screening   Patient Details  Name: Jonathan Mathews MRN: [id number] Date of Birth: 03/06/1933  Transition of Care [facility]) CM/SW Contact:    Katha Hamming, RN Phone Number: 03/11/2023, 10:54 PM    CAGE-AID Screening:    Have You Ever Felt You Ought to Cut Down on Your Drinking or Drug Use?: No Have People Annoyed You By [employer] Your Drinking Or Drug Use?: No Have You Felt Bad Or Guilty About Your Drinking Or Drug Use?: No Have You Ever Had a Drink or Used Drugs First Thing In The Morning to Steady Your Nerves or to Get Rid of a Hangover?: No CAGE-AID Score: 0  Substance Abuse Education Offered: No

## 2023-03-11 NOTE — ED Provider Notes (Signed)
[place] EMERGENCY DEPARTMENT AT [facility] Provider Note  CSN: [id number] Arrival date & time: 03/11/23 2138  Chief Complaint(s) Fall  HPI Jonathan Mathews is a 89 y.o. male with past medical history as below, significant for A-fib on Pradaxa, HLD, hypertension, CAD, appendectomy, tonsillectomy who presents to the ED with complaint of fall  Patient reports this prior to arrival he was walking outside, he slipped on a patch of ice and fell onto his right side.  Excruciating pain to his right hip.  Braced himself with his hands and has abrasions to his hands.  Tetanus in the last 2 to 3 years per patient.  Denies head injury or LOC.  Hypoxic on arrival although he denies any difficulty breathing  Will escalate to level 2 trauma  Past Medical History Past Medical History:  Diagnosis Date   Atrial fibrillation (HCC)    BPH (benign prostatic hyperplasia)    Coronary artery disease    Environmental allergies    GERD (gastroesophageal reflux disease)    High cholesterol    Hypertension    Ventricular tachycardia (HCC)    There are no active problems to display for this patient.  Home Medication(s) Prior to Admission medications   Medication Sig Start Date End Date Taking? Authorizing Provider  albuterol (PROVENTIL) (2.5 MG/3ML) 0.083% nebulizer solution Inhale 2.5 mg into the lungs in the morning and at bedtime. 03/11/18  Yes [provider]  albuterol (VENTOLIN HFA) 108 (90 Base) MCG/ACT inhaler Inhale 1-2 puffs into the lungs every 6 (six) hours as needed for wheezing or shortness of breath. 02/10/17  Yes [provider]  ALPRAZolam Prudy Feeler) 0.5 MG tablet Take 0.5 mg by mouth daily as needed for anxiety.   Yes [provider]  azelastine (ASTELIN) 137 MCG/SPRAY nasal spray Place 1 spray into the nose 2 (two) times daily. Use in each nostril as directed   Yes [provider]  fish oil-omega-3 fatty acids 1000 MG capsule Take 2 g by mouth  daily.   Yes [provider]  fluticasone (FLONASE) 50 MCG/ACT nasal spray USE 2 SPRAYS NASALLY DAILY 02/19/18  Yes [provider]  furosemide (LASIX) 40 MG tablet Take 20 mg by mouth daily.   Yes [provider]  irbesartan (AVAPRO) 150 MG tablet Take 150 mg by mouth in the morning and at bedtime. 07/20/18  Yes [provider]  ketoconazole (NIZORAL) 2 % cream Apply topically daily.   Yes [provider]  montelukast (SINGULAIR) 10 MG tablet Take 10 mg by mouth at bedtime. 07/14/18  Yes [provider]  omeprazole (PRILOSEC) 40 MG capsule Take 40 mg by mouth daily.   Yes [provider]  polyethylene glycol powder (GLYCOLAX/MIRALAX) 17 GM/SCOOP powder  06/14/18   [provider]  potassium chloride (K-DUR) 10 MEQ tablet Take by mouth. 12/14/17   [provider]  rosuvastatin (CRESTOR) 5 MG tablet Take 5 mg by mouth daily.    [provider]  Past Surgical History Past Surgical History:  Procedure Laterality Date   APPENDECTOMY     TONSILLECTOMY     Family History History reviewed. No pertinent family history.  Social History Social History   Tobacco Use   Smoking status: Former   Smokeless tobacco: Never  Substance Use Topics   Alcohol use: No   Drug use: No   Allergies Clindamycin/lincomycin, Doxycycline, Pravastatin, Zocor [simvastatin], and Ivp dye [iodinated contrast media]  Review of Systems Review of Systems  Constitutional:  Negative for chills and fever.  HENT:  Negative for congestion.   Respiratory:  Negative for cough, chest tightness and shortness of breath.   Cardiovascular:  Negative for chest pain and palpitations.  Gastrointestinal:  Negative for abdominal pain, nausea and vomiting.  Genitourinary:  Negative for difficulty urinating and dysuria.   Musculoskeletal:  Positive for arthralgias and joint swelling.  Neurological:  Negative for syncope, light-headedness and headaches.  All other systems reviewed and are negative.   Physical Exam Vital Signs  I have reviewed the triage vital signs BP (!) 165/93   Pulse 79   Temp 98.3 F (36.8 C) (Axillary)   Resp (!) 21   Ht 5\' 8"  (1.727 m)   Wt 77.1 kg   SpO2 95%   BMI 25.85 kg/m  Physical Exam Vitals and nursing note reviewed.  Constitutional:      General: He is not in acute distress.    Appearance: He is well-developed.  HENT:     Head: Normocephalic and atraumatic.     Right Ear: External ear normal.     Left Ear: External ear normal.     Mouth/Throat:     Mouth: Mucous membranes are moist.  Eyes:     General: No scleral icterus. Cardiovascular:     Rate and Rhythm: Normal rate and regular rhythm.     Pulses: Normal pulses.     Heart sounds: Normal heart sounds.  Pulmonary:     Effort: Pulmonary effort is normal. No respiratory distress.     Breath sounds: Normal breath sounds.  Abdominal:     General: Abdomen is flat.     Palpations: Abdomen is soft.     Tenderness: There is no abdominal tenderness.  Musculoskeletal:     Cervical back: No rigidity.     Right lower leg: No edema.     Left lower leg: No edema.  Skin:    General: Skin is warm and dry.     Capillary Refill: Capillary refill takes less than 2 seconds.  Neurological:     Mental Status: He is alert.  Psychiatric:        Mood and Affect: Mood normal.        Behavior: Behavior normal.     ED Results and Treatments Labs (all labs ordered are listed, but only abnormal results are displayed) Labs Reviewed  COMPREHENSIVE METABOLIC PANEL - Abnormal; Notable for the following components:      Result Value   Glucose, Bld 138 (*)    Creatinine, Ser 1.88 (*)    Calcium 8.6 (*)    Total Protein 5.8 (*)    Albumin 3.4 (*)    Total Bilirubin 1.5 (*)    GFR, Estimated 34 (*)    All other  components within normal limits  CBC - Abnormal; Notable for the following components:   WBC 11.6 (*)    RDW 16.1 (*)    All other components within normal limits  BRAIN NATRIURETIC PEPTIDE -  Abnormal; Notable for the following components:   B Natriuretic Peptide 185.3 (*)    All other components within normal limits  I-STAT CHEM 8, ED - Abnormal; Notable for the following components:   BUN 30 (*)    Creatinine, Ser 2.00 (*)    Glucose, Bld 135 (*)    Calcium, Ion 1.13 (*)    All other components within normal limits  RESP PANEL BY RT-PCR (RSV, FLU A&B, COVID)  RVPGX2  ETHANOL  PROTIME-INR  URINALYSIS, ROUTINE W REFLEX MICROSCOPIC  I-STAT CG4 LACTIC ACID, ED  SAMPLE TO BLOOD BANK  TROPONIN I (HIGH SENSITIVITY)  TROPONIN I (HIGH SENSITIVITY)                                                                                                                          Radiology CT CHEST ABDOMEN PELVIS WO CONTRAST Result Date: 03/11/2023 CLINICAL DATA:  Polytrauma, blunt EXAM: CT CHEST, ABDOMEN AND PELVIS WITHOUT CONTRAST TECHNIQUE: Multidetector CT imaging of the chest, abdomen and pelvis was performed following the standard protocol without IV contrast. RADIATION DOSE REDUCTION: This exam was performed according to the departmental dose-optimization program which includes automated exposure control, adjustment of the mA and/or kV according to patient size and/or use of iterative reconstruction technique. COMPARISON:  None Available. FINDINGS: CHEST: Cardiovascular: The thoracic aorta is normal in caliber. The heart is normal in size. No significant pericardial effusion. Four-vessel coronary calcification. Lungs/Pleura: Centrilobular and paraseptal mild-to-moderate emphysematous changes. Diffuse bronchial wall thickening. Dependent bilateral upper and lower lung zones patchy airspace opacities. No pulmonary nodule. No pulmonary mass. No pulmonary contusion or laceration. No pneumatocele formation.  No pleural effusion. No pneumothorax. No hemothorax. Mediastinum/Nodes: No pneumomediastinum. The central airways are patent. The esophagus is unremarkable.  Small hiatal hernia. The thyroid is unremarkable. Limited evaluation for hilar lymphadenopathy on this noncontrast study. No mediastinal or axillary lymphadenopathy. Musculoskeletal/Chest wall No chest wall mass.  Bilateral gynecomastia. No acute rib or sternal fracture. No spinal fracture. ABDOMEN / PELVIS: Hepatobiliary: Not enlarged. No focal lesion. The gallbladder is otherwise unremarkable with no radio-opaque gallstones. No biliary ductal dilatation. Pancreas: Normal pancreatic contour. No main pancreatic duct dilatation. Spleen: Not enlarged. No focal lesion. Adrenals/Urinary Tract: No nodularity bilaterally. No hydroureteronephrosis. No nephroureterolithiasis. No contour deforming renal mass. The urinary bladder is unremarkable. Stomach/Bowel: No small or large bowel wall thickening or dilatation. Colonic diverticulosis. The appendix is unremarkable. Vasculature/Lymphatic: Severe atherosclerotic plaque. No abdominal aorta or iliac aneurysm. No abdominal, pelvic, inguinal lymphadenopathy. Reproductive: Normal. Other: No simple free fluid ascites. No pneumoperitoneum. No mesenteric hematoma identified. No organized fluid collection. Musculoskeletal: No significant soft tissue hematoma. Acute displaced, comminuted, impacted right femoral neck fracture. No spinal fracture. Multilevel degenerative changes and intervertebral disc space vacuum phenomenon. Ports and Devices: None. IMPRESSION: 1. No acute intrathoracic, intra-abdominal, intrapelvic traumatic injury. 2. No acute fracture or traumatic malalignment of the thoracic or lumbar spine. 3. Acute displaced, comminuted, impacted right femoral neck fracture. 4. Diffuse bronchial wall  thickening. Dependent bilateral upper and lower lung zones patchy airspace opacities. Findings could represent developing  infection/inflammation (aspiration pneumonia). Recommend follow up CT in 3 months to evaluate for complete resolution. 5. Other imaging findings of potential clinical significance: Small hiatal hernia. Colonic diverticulosis with no acute diverticulitis. Aortic Atherosclerosis (ICD10-I70.0) including four-vessel coronary calcifications. 6. Emphysema (ICD10-J43.9) . Electronically Signed   By: Tish Frederickson M.D.   On: 03/11/2023 23:40   CT HIP RIGHT WO CONTRAST Result Date: 03/11/2023 CLINICAL DATA:  Hip trauma, fracture suspected, xray done EXAM: CT OF THE RIGHT HIP WITHOUT CONTRAST TECHNIQUE: Multidetector CT imaging of the right hip was performed according to the standard protocol. Multiplanar CT image reconstructions were also generated. RADIATION DOSE REDUCTION: This exam was performed according to the departmental dose-optimization program which includes automated exposure control, adjustment of the mA and/or kV according to patient size and/or use of iterative reconstruction technique. COMPARISON:  CT abdomen pelvis 03/11/2023, x-ray right hip 03/11/2023 FINDINGS: Bones/Joint/Cartilage Acute displaced, comminuted, and impacted right femoral neck fracture. No dislocation. No acute fracture of the right pelvic bones. Ligaments Suboptimally assessed by CT. Muscles and Tendons Grossly unremarkable. Soft tissues Right hip subcutaneus soft tissue edema/hematoma formation. Other: Colonic diverticulosis.  Atherosclerotic plaque. IMPRESSION: 1. Acute displaced, comminuted, and impacted right femoral neck fracture. 2.  Aortic Atherosclerosis (ICD10-I70.0). Electronically Signed   By: Tish Frederickson M.D.   On: 03/11/2023 23:04   CT HEAD WO CONTRAST Result Date: 03/11/2023 CLINICAL DATA:  Head trauma, moderate-severe; Polytrauma, blunt. Fall EXAM: CT HEAD WITHOUT CONTRAST CT CERVICAL SPINE WITHOUT CONTRAST TECHNIQUE: Multidetector CT imaging of the head and cervical spine was performed following the standard  protocol without intravenous contrast. Multiplanar CT image reconstructions of the cervical spine were also generated. RADIATION DOSE REDUCTION: This exam was performed according to the departmental dose-optimization program which includes automated exposure control, adjustment of the mA and/or kV according to patient size and/or use of iterative reconstruction technique. COMPARISON:  None Available. FINDINGS: CT HEAD FINDINGS Brain: Cerebral ventricle sizes are concordant with the degree of cerebral volume loss. Patchy and confluent areas of decreased attenuation are noted throughout the deep and periventricular white matter of the cerebral hemispheres bilaterally, compatible with chronic microvascular ischemic disease. No evidence of large-territorial acute infarction. No parenchymal hemorrhage. No mass lesion. No extra-axial collection. No mass effect or midline shift. No hydrocephalus. Basilar cisterns are patent. Vascular: No hyperdense vessel. Atherosclerotic calcifications are present within the cavernous internal carotid and vertebral arteries. Skull: No acute fracture or focal lesion. Sinuses/Orbits: Left frontal, bilateral ethmoid, bilateral maxillary sinus mucosal thickening. Otherwise paranasal sinuses and mastoid air cells are clear. Right lens replacement. Otherwise the orbits are unremarkable. Other: None. CT CERVICAL SPINE FINDINGS Alignment: Normal. Skull base and vertebrae: Multilevel moderate degenerative changes of the spine. No acute fracture. No aggressive appearing focal osseous lesion or focal pathologic process. Soft tissues and spinal canal: No prevertebral fluid or swelling. No visible canal hematoma. Upper chest: Biapical patchy airspace opacities. Emphysematous changes. Other: Atherosclerotic plaque of the aortic arch and its branches. IMPRESSION: 1. No acute intracranial abnormality. 2. No acute displaced fracture or traumatic listhesis of the cervical spine. 3. Sinus disease. 4.  Biapical patchy airspace opacities. Please see separately dictated CT chest 03/11/2023. 5. Aortic Atherosclerosis (ICD10-I70.0) and Emphysema (ICD10-J43.9). Electronically Signed   By: Tish Frederickson M.D.   On: 03/11/2023 23:02   CT CERVICAL SPINE WO CONTRAST Result Date: 03/11/2023 CLINICAL DATA:  Head trauma, moderate-severe; Polytrauma, blunt. Fall EXAM: CT  HEAD WITHOUT CONTRAST CT CERVICAL SPINE WITHOUT CONTRAST TECHNIQUE: Multidetector CT imaging of the head and cervical spine was performed following the standard protocol without intravenous contrast. Multiplanar CT image reconstructions of the cervical spine were also generated. RADIATION DOSE REDUCTION: This exam was performed according to the departmental dose-optimization program which includes automated exposure control, adjustment of the mA and/or kV according to patient size and/or use of iterative reconstruction technique. COMPARISON:  None Available. FINDINGS: CT HEAD FINDINGS Brain: Cerebral ventricle sizes are concordant with the degree of cerebral volume loss. Patchy and confluent areas of decreased attenuation are noted throughout the deep and periventricular white matter of the cerebral hemispheres bilaterally, compatible with chronic microvascular ischemic disease. No evidence of large-territorial acute infarction. No parenchymal hemorrhage. No mass lesion. No extra-axial collection. No mass effect or midline shift. No hydrocephalus. Basilar cisterns are patent. Vascular: No hyperdense vessel. Atherosclerotic calcifications are present within the cavernous internal carotid and vertebral arteries. Skull: No acute fracture or focal lesion. Sinuses/Orbits: Left frontal, bilateral ethmoid, bilateral maxillary sinus mucosal thickening. Otherwise paranasal sinuses and mastoid air cells are clear. Right lens replacement. Otherwise the orbits are unremarkable. Other: None. CT CERVICAL SPINE FINDINGS Alignment: Normal. Skull base and vertebrae:  Multilevel moderate degenerative changes of the spine. No acute fracture. No aggressive appearing focal osseous lesion or focal pathologic process. Soft tissues and spinal canal: No prevertebral fluid or swelling. No visible canal hematoma. Upper chest: Biapical patchy airspace opacities. Emphysematous changes. Other: Atherosclerotic plaque of the aortic arch and its branches. IMPRESSION: 1. No acute intracranial abnormality. 2. No acute displaced fracture or traumatic listhesis of the cervical spine. 3. Sinus disease. 4. Biapical patchy airspace opacities. Please see separately dictated CT chest 03/11/2023. 5. Aortic Atherosclerosis (ICD10-I70.0) and Emphysema (ICD10-J43.9). Electronically Signed   By: Tish Frederickson M.D.   On: 03/11/2023 23:02   DG Chest Port 1 View Result Date: 03/11/2023 CLINICAL DATA:  Trauma. EXAM: PORTABLE CHEST 1 VIEW COMPARISON:  Radiograph 02/16/2023 FINDINGS: Lung volumes are low. Stable heart size and mediastinal contours. There are diffuse interstitial opacities are new from prior exam, suspicious for pulmonary edema. Possible left pleural effusion. No convincing pneumothorax skull multiple moderate overlying artifacts. On limited assessment, no displaced rib fracture. IMPRESSION: Low lung volumes. Diffuse interstitial opacities suspicious for pulmonary edema. Possible small left pleural effusion. Electronically Signed   By: Narda Rutherford M.D.   On: 03/11/2023 22:14   DG Pelvis Portable Result Date: 03/11/2023 CLINICAL DATA:  Blunt trauma, pain. EXAM: RIGHT FEMUR PORTABLE 1 VIEW; PORTABLE PELVIS 1-2 VIEWS COMPARISON:  None Available. FINDINGS: Pelvis: Displaced right femoral neck fracture. Proximal migration of the femoral shaft. No additional pelvic fracture. Pubic rami are intact. Pubic symphysis and sacroiliac joints are congruent. Femur: Frontal views of the right femur obtained. Displaced femoral neck fracture with proximal migration of the femoral shaft. The distal femur  is intact. Distal most femur including the knee not included in the field of view. IMPRESSION: Displaced right femoral neck fracture. Electronically Signed   By: Narda Rutherford M.D.   On: 03/11/2023 22:13   DG FEMUR PORT, 1V RIGHT Result Date: 03/11/2023 CLINICAL DATA:  Blunt trauma, pain. EXAM: RIGHT FEMUR PORTABLE 1 VIEW; PORTABLE PELVIS 1-2 VIEWS COMPARISON:  None Available. FINDINGS: Pelvis: Displaced right femoral neck fracture. Proximal migration of the femoral shaft. No additional pelvic fracture. Pubic rami are intact. Pubic symphysis and sacroiliac joints are congruent. Femur: Frontal views of the right femur obtained. Displaced femoral neck fracture with proximal migration  of the femoral shaft. The distal femur is intact. Distal most femur including the knee not included in the field of view. IMPRESSION: Displaced right femoral neck fracture. Electronically Signed   By: Narda Rutherford M.D.   On: 03/11/2023 22:13    Pertinent labs & imaging results that were available during my care of the patient were reviewed by me and considered in my medical decision making (see MDM for details).  Medications Ordered in ED Medications  cefTRIAXone (ROCEPHIN) 1 g in sodium chloride 0.9 % 100 mL IVPB (has no administration in time range)  azithromycin (ZITHROMAX) 500 mg in sodium chloride 0.9 % 250 mL IVPB (has no administration in time range)  fentaNYL (SUBLIMAZE) injection 50 mcg (50 mcg Intravenous Given 03/11/23 2146)  ondansetron (ZOFRAN) injection 4 mg (4 mg Intravenous Given 03/11/23 2146)  sodium chloride 0.9 % bolus 1,000 mL (0 mLs Intravenous Stopped 03/11/23 2338)  fentaNYL (SUBLIMAZE) injection 50 mcg (50 mcg Intravenous Given 03/11/23 2238)  ceFAZolin (ANCEF) IVPB 2g/100 mL premix (0 g Intravenous Stopped 03/11/23 2244)  ipratropium-albuterol (DUONEB) 0.5-2.5 (3) MG/3ML nebulizer solution 3 mL (3 mLs Nebulization Given 03/11/23 2337)  methylPREDNISolone sodium succinate (SOLU-MEDROL) 125 mg/2  mL injection 125 mg (125 mg Intravenous Given 03/11/23 2337)                                                                                                                                     Procedures .Critical Care  Performed by: Sloan Leiter, DO Authorized by: Sloan Leiter, DO   Critical care provider statement:    Critical care time (minutes):  70   Critical care time was exclusive of:  Separately billable procedures and treating other patients   Critical care was necessary to treat or prevent imminent or life-threatening deterioration of the following conditions:  Trauma and respiratory failure   Critical care was time spent personally by me on the following activities:  Development of treatment plan with patient or surrogate, discussions with consultants, evaluation of patient's response to treatment, examination of patient, ordering and review of laboratory studies, ordering and review of radiographic studies, ordering and performing treatments and interventions, pulse oximetry, re-evaluation of patient's condition, review of old charts and obtaining history from patient or surrogate   Care discussed with: admitting provider     (including critical care time)  Medical Decision Making / ED Course    Medical Decision Making:    DAWOOD HALLMON is a 89 y.o. male with past medical history as below, significant for A-fib on Pradaxa, HLD, hypertension, CAD, appendectomy, tonsillectomy who presents to the ED with complaint of fall. The complaint involves an extensive differential diagnosis and also carries with it a high risk of complications and morbidity.  Serious etiology was considered. Ddx includes but is not limited to: fracture, dislocation, msk injury, PE, ACS, COPD, chf  Differential diagnoses for head trauma includes subdural hematoma, epidural  hematoma, acute concussion, traumatic subarachnoid hemorrhage, cerebral contusions, etc.   Complete initial physical exam  performed, notably the patient was in mild distress 2/2 pain, hypoxia noted although he denies any dib.    Reviewed and confirmed nursing documentation for past medical history, family history, social history.  Vital signs reviewed.      Clinical Course as of 03/11/23 2345  Thu Mar 11, 2023  2200  right femur fx on wet read at bedside  [SG]  2236 Hypoxia on 15L NRB, will trial salter. He denies dyspnea. He has hx copd, no home o2 use [SG]  2246 Currently on pradaxa per family at bedside [SG]  2321 Spoke w/ dr Linna Caprice, admit to medicine.  [SG]  2321 Ct w/ ?multifocal pna on wet read  [SG]    Clinical Course User Index [SG] Sloan Leiter, DO    Brief summary: 89 year old male history as above including A-fib on Pradaxa here with ground-level fall.  He slipped on some ice and landed on his right side.  Deformity to his right femur, he has femoral neck fracture on bedside x-ray.  LE NVI, DP pulses equal bilateral.  He has persistent hypoxia, no home oxygen use, will transition from NRB to salter.  He denies any difficulty breathing but does have persistently low pulse ox.  Will give DuoNeb and Solu-Medrol given history of COPD although no obvious wheezing on exam.  No home oxygen requirement per family at bedside  Patient has documented contrast allergy, reports that he had hives with previous contrast, unsure if he had difficulty breathing.  Will obtain imaging without contrast at this time.  Pulse ox improved on heated high flow.   Patient will require admission for femur fracture also hypoxia, possibly secondary to multifocal pneumonia vs pulm edema; pending CT imaging. He does not appear overtly volume overloaded on exam, no LE edema. No crackles on auscultation. He has hx CHF, on lasix.  Imaging on my read is more consistent with multifocal pneumonia rather than pulmonary edema.  Also concern for AKI. Handoff to incoming EP pending CT and admission            Additional  history obtained: -Additional history obtained from family, ems -External records from outside source obtained and reviewed including: Chart review including previous notes, labs, imaging, consultation notes including  Cardiology documentation, primary care documentation, home medications prior ED visit 01/02/2023 at [facility] on 11/24 at [facility]   Lab Tests: -I ordered, reviewed, and interpreted labs.   The pertinent results include:   Labs Reviewed  COMPREHENSIVE METABOLIC PANEL - Abnormal; Notable for the following components:      Result Value   Glucose, Bld 138 (*)    Creatinine, Ser 1.88 (*)    Calcium 8.6 (*)    Total Protein 5.8 (*)    Albumin 3.4 (*)    Total Bilirubin 1.5 (*)    GFR, Estimated 34 (*)    All other components within normal limits  CBC - Abnormal; Notable for the following components:   WBC 11.6 (*)    RDW 16.1 (*)    All other components within normal limits  BRAIN NATRIURETIC PEPTIDE - Abnormal; Notable for the following components:   B Natriuretic Peptide 185.3 (*)    All other components within normal limits  I-STAT CHEM 8, ED - Abnormal; Notable for the following components:   BUN 30 (*)    Creatinine, Ser 2.00 (*)    Glucose, Bld 135 (*)  Calcium, Ion 1.13 (*)    All other components within normal limits  RESP PANEL BY RT-PCR (RSV, FLU A&B, COVID)  [id number]  ETHANOL  PROTIME-INR  URINALYSIS, ROUTINE W REFLEX MICROSCOPIC  I-STAT CG4 LACTIC ACID, ED  SAMPLE TO BLOOD BANK  TROPONIN I (HIGH SENSITIVITY)  TROPONIN I (HIGH SENSITIVITY)    Notable for as above, stable  EKG   EKG Interpretation Date/Time:  Thursday March 11 2023 22:46:45 EST Ventricular Rate:  74 PR Interval:  246 QRS Duration:  128 QT Interval:  454 QTC Calculation: 504 R Axis:   -88  Text Interpretation: Sinus rhythm Prolonged PR interval Nonspecific IVCD with LAD no prior Confirmed by Tanda Rockers ([id number]) on 03/11/2023 10:51:25 PM         Imaging Studies  ordered: I ordered imaging studies including CT head/cspine,CAP, CXR, pelvis xr, femur xr I independently visualized the following imaging with scope of interpretation limited to determining acute life threatening conditions related to emergency care; findings noted above I independently visualized and interpreted imaging. I agree with the radiologist interpretation   Medicines ordered and prescription drug management: Meds ordered this encounter  Medications   fentaNYL (SUBLIMAZE) injection 50 mcg   ondansetron (ZOFRAN) injection 4 mg   sodium chloride 0.9 % bolus 1,000 mL   fentaNYL (SUBLIMAZE) injection 50 mcg   ceFAZolin (ANCEF) IVPB 2g/100 mL premix    Antibiotic Indication::   Other Indication (list below)    Other Indication::   open wound   ipratropium-albuterol (DUONEB) 0.5-2.5 (3) MG/3ML nebulizer solution 3 mL   methylPREDNISolone sodium succinate (SOLU-MEDROL) 125 mg/2 mL injection 125 mg    IV methylprednisolone will be converted to either a q12h or q24h frequency with the same total daily dose (TDD).  Ordered Dose: 1 to 125 mg TDD; convert to: TDD q24h.  Ordered Dose: 126 to 250 mg TDD; convert to: TDD div q12h.  Ordered Dose: >250 mg TDD; DAW.   DISCONTD: furosemide (LASIX) injection 40 mg   cefTRIAXone (ROCEPHIN) 1 g in sodium chloride 0.9 % 100 mL IVPB    Antibiotic Indication::   CAP   azithromycin (ZITHROMAX) 500 mg in sodium chloride 0.9 % 250 mL IVPB    Antibiotic Indication::   CAP    -I have reviewed the patients home medicines and have made adjustments as needed   Consultations Obtained: I requested consultation with the ortho,  and discussed lab and imaging findings as well as pertinent plan - they recommend: medicine admit   Cardiac Monitoring: The patient was maintained on a cardiac monitor.  I personally viewed and interpreted the cardiac monitored which showed an underlying rhythm of: NSR Continuous pulse oximetry interpreted by myself, 85% on RA.  91% on 15L NRB   Social Determinants of Health:  Diagnosis or treatment significantly limited by social determinants of health: former smoker   Reevaluation: After the interventions noted above, I reevaluated the patient and found that they have improved  Co morbidities that complicate the patient evaluation  Past Medical History:  Diagnosis Date   Atrial fibrillation (HCC)    BPH (benign prostatic hyperplasia)    Coronary artery disease    Environmental allergies    GERD (gastroesophageal reflux disease)    High cholesterol    Hypertension    Ventricular tachycardia (HCC)       Dispostion: Disposition decision including need for hospitalization was considered, and patient disposition pending at time of sign out.    Final Clinical Impression(s) / ED Diagnoses  Final diagnoses:  Fall, initial encounter  Closed fracture of neck of right femur, initial encounter (HCC)  Acute respiratory failure with hypoxia (HCC)  AKI (acute kidney injury) (HCC)        Sloan Leiter, DO 03/11/23 [id number]

## 2023-03-11 NOTE — Progress Notes (Signed)
Orthopedic Tech Progress Note Patient Details:  Jonathan Mathews 1933-11-04 [id number] Responded to level 2 trauma, not needed at this time Patient ID: [name], male   DOB: 1933/08/19, 89 y.o.   MRN: [id number]  Diannia Ruder 03/11/2023, 10:00 PM

## 2023-03-11 NOTE — Progress Notes (Signed)
RT attempted to place patient on HFNC (salter) due to desaturation into the low 80s. Saturations did not budge, patient placed on NRB to maintain low 90s. Pt now on HHFNC 60L 100% per MD verbal order.

## 2023-03-12 ENCOUNTER — Inpatient Hospital Stay (HOSPITAL_COMMUNITY): Payer: Medicare Other | Admitting: Anesthesiology

## 2023-03-12 DIAGNOSIS — J9601 Acute respiratory failure with hypoxia: Secondary | ICD-10-CM

## 2023-03-12 DIAGNOSIS — J449 Chronic obstructive pulmonary disease, unspecified: Secondary | ICD-10-CM

## 2023-03-12 DIAGNOSIS — R54 Age-related physical debility: Secondary | ICD-10-CM | POA: Diagnosis present

## 2023-03-12 DIAGNOSIS — J189 Pneumonia, unspecified organism: Secondary | ICD-10-CM | POA: Diagnosis present

## 2023-03-12 DIAGNOSIS — I480 Paroxysmal atrial fibrillation: Secondary | ICD-10-CM

## 2023-03-12 DIAGNOSIS — E7800 Pure hypercholesterolemia, unspecified: Secondary | ICD-10-CM | POA: Diagnosis present

## 2023-03-12 DIAGNOSIS — I429 Cardiomyopathy, unspecified: Secondary | ICD-10-CM

## 2023-03-12 DIAGNOSIS — Z66 Do not resuscitate: Secondary | ICD-10-CM | POA: Diagnosis not present

## 2023-03-12 DIAGNOSIS — Z1152 Encounter for screening for COVID-19: Secondary | ICD-10-CM | POA: Diagnosis not present

## 2023-03-12 DIAGNOSIS — I2510 Atherosclerotic heart disease of native coronary artery without angina pectoris: Secondary | ICD-10-CM | POA: Diagnosis present

## 2023-03-12 DIAGNOSIS — Y838 Other surgical procedures as the cause of abnormal reaction of the patient, or of later complication, without mention of misadventure at the time of the procedure: Secondary | ICD-10-CM | POA: Diagnosis not present

## 2023-03-12 DIAGNOSIS — S60512A Abrasion of left hand, initial encounter: Secondary | ICD-10-CM | POA: Diagnosis present

## 2023-03-12 DIAGNOSIS — I493 Ventricular premature depolarization: Secondary | ICD-10-CM | POA: Diagnosis present

## 2023-03-12 DIAGNOSIS — N183 Chronic kidney disease, stage 3 unspecified: Secondary | ICD-10-CM

## 2023-03-12 DIAGNOSIS — I130 Hypertensive heart and chronic kidney disease with heart failure and stage 1 through stage 4 chronic kidney disease, or unspecified chronic kidney disease: Secondary | ICD-10-CM | POA: Diagnosis present

## 2023-03-12 DIAGNOSIS — I5021 Acute systolic (congestive) heart failure: Secondary | ICD-10-CM | POA: Diagnosis not present

## 2023-03-12 DIAGNOSIS — I5023 Acute on chronic systolic (congestive) heart failure: Secondary | ICD-10-CM | POA: Diagnosis present

## 2023-03-12 DIAGNOSIS — I428 Other cardiomyopathies: Secondary | ICD-10-CM | POA: Diagnosis present

## 2023-03-12 DIAGNOSIS — J431 Panlobular emphysema: Secondary | ICD-10-CM | POA: Diagnosis present

## 2023-03-12 DIAGNOSIS — I110 Hypertensive heart disease with heart failure: Secondary | ICD-10-CM | POA: Diagnosis not present

## 2023-03-12 DIAGNOSIS — Z515 Encounter for palliative care: Secondary | ICD-10-CM | POA: Diagnosis not present

## 2023-03-12 DIAGNOSIS — W000XXA Fall on same level due to ice and snow, initial encounter: Secondary | ICD-10-CM | POA: Diagnosis present

## 2023-03-12 DIAGNOSIS — S72001A Fracture of unspecified part of neck of right femur, initial encounter for closed fracture: Secondary | ICD-10-CM | POA: Diagnosis present

## 2023-03-12 DIAGNOSIS — S60511A Abrasion of right hand, initial encounter: Secondary | ICD-10-CM | POA: Diagnosis present

## 2023-03-12 DIAGNOSIS — K219 Gastro-esophageal reflux disease without esophagitis: Secondary | ICD-10-CM | POA: Diagnosis present

## 2023-03-12 DIAGNOSIS — I472 Ventricular tachycardia, unspecified: Secondary | ICD-10-CM | POA: Diagnosis present

## 2023-03-12 DIAGNOSIS — Z01810 Encounter for preprocedural cardiovascular examination: Secondary | ICD-10-CM

## 2023-03-12 DIAGNOSIS — I471 Supraventricular tachycardia, unspecified: Secondary | ICD-10-CM | POA: Diagnosis present

## 2023-03-12 DIAGNOSIS — N1831 Chronic kidney disease, stage 3a: Secondary | ICD-10-CM | POA: Diagnosis present

## 2023-03-12 DIAGNOSIS — N179 Acute kidney failure, unspecified: Secondary | ICD-10-CM | POA: Diagnosis present

## 2023-03-12 DIAGNOSIS — I5020 Unspecified systolic (congestive) heart failure: Secondary | ICD-10-CM | POA: Diagnosis not present

## 2023-03-12 DIAGNOSIS — J440 Chronic obstructive pulmonary disease with acute lower respiratory infection: Secondary | ICD-10-CM | POA: Diagnosis present

## 2023-03-12 DIAGNOSIS — J690 Pneumonitis due to inhalation of food and vomit: Secondary | ICD-10-CM | POA: Diagnosis not present

## 2023-03-12 LAB — TROPONIN I (HIGH SENSITIVITY): Troponin I (High Sensitivity): 26 ng/L — ABNORMAL HIGH (ref <18)

## 2023-03-12 LAB — TYPE AND SCREEN
ABO/RH(D): O POS
Antibody Screen: NEGATIVE

## 2023-03-12 LAB — URINALYSIS, ROUTINE W REFLEX MICROSCOPIC
Bilirubin Urine: NEGATIVE
Glucose, UA: NEGATIVE mg/dL
Hgb urine dipstick: NEGATIVE
Ketones, ur: 5 mg/dL — AB
Leukocytes,Ua: NEGATIVE
Nitrite: NEGATIVE
Protein, ur: NEGATIVE mg/dL
Specific Gravity, Urine: 1.016 (ref 1.005–1.030)
pH: 5 (ref 5.0–8.0)

## 2023-03-12 LAB — SARS CORONAVIRUS 2 BY RT PCR: SARS Coronavirus 2 by RT PCR: NEGATIVE

## 2023-03-12 LAB — RESPIRATORY PANEL BY PCR

## 2023-03-12 LAB — STREP PNEUMONIAE URINARY ANTIGEN: Strep Pneumo Urinary Antigen: NEGATIVE

## 2023-03-12 LAB — RESP PANEL BY RT-PCR (RSV, FLU A&B, COVID)  RVPGX2
Influenza A by PCR: NEGATIVE
Influenza B by PCR: NEGATIVE
Resp Syncytial Virus by PCR: NEGATIVE
SARS Coronavirus 2 by RT PCR: NEGATIVE

## 2023-03-12 LAB — ABO/RH: ABO/RH(D): O POS

## 2023-03-12 LAB — HIV ANTIBODY (ROUTINE TESTING W REFLEX): HIV Screen 4th Generation wRfx: NONREACTIVE

## 2023-03-12 MED ORDER — POLYETHYLENE GLYCOL 3350 17 G PO PACK: 17.0000 g | PACK | Freq: Every day | ORAL | Status: DC | PRN

## 2023-03-12 MED ORDER — CARVEDILOL 6.25 MG PO TABS
6.2500 mg | ORAL_TABLET | Freq: Two times a day (BID) | ORAL | Status: DC
Start: 2023-03-12 — End: 2023-03-19
  Administered 2023-03-12 – 2023-03-19 (×13): 6.25 mg via ORAL
  Filled 2023-03-12 (×2): qty 1
  Filled 2023-03-12: qty 2
  Filled 2023-03-12 (×6): qty 1
  Filled 2023-03-12: qty 2
  Filled 2023-03-12 (×3): qty 1

## 2023-03-12 MED ORDER — CLOPIDOGREL BISULFATE 75 MG PO TABS
75.0000 mg | ORAL_TABLET | Freq: Every day | ORAL | Status: DC
  Administered 2023-03-12 – 2023-03-13 (×2): 75 mg via ORAL
  Filled 2023-03-12 (×2): qty 1

## 2023-03-12 MED ORDER — ROPIVACAINE HCL 5 MG/ML IJ SOLN
INTRAMUSCULAR | Status: DC | PRN
  Administered 2023-03-12: 30 mL via PERINEURAL

## 2023-03-12 MED ORDER — HYDROCODONE-ACETAMINOPHEN 5-325 MG PO TABS
1.0000 | ORAL_TABLET | Freq: Four times a day (QID) | ORAL | Status: DC | PRN
  Administered 2023-03-12: 1 via ORAL
  Administered 2023-03-12: 2 via ORAL
  Administered 2023-03-12: 1 via ORAL
  Administered 2023-03-13 (×2): 2 via ORAL
  Filled 2023-03-12: qty 1
  Filled 2023-03-12 (×2): qty 2
  Filled 2023-03-12: qty 1
  Filled 2023-03-12 (×2): qty 2

## 2023-03-12 MED ORDER — ROSUVASTATIN CALCIUM 10 MG PO TABS
10.0000 mg | ORAL_TABLET | Freq: Every day | ORAL | Status: DC
  Administered 2023-03-12 – 2023-03-19 (×8): 10 mg via ORAL
  Filled 2023-03-12 (×4): qty 1
  Filled 2023-03-12: qty 2
  Filled 2023-03-12 (×3): qty 1

## 2023-03-12 MED ORDER — SODIUM CHLORIDE 0.9 % IV SOLN
500.0000 mg | INTRAVENOUS | Status: AC
  Administered 2023-03-13 – 2023-03-16 (×5): 500 mg via INTRAVENOUS
  Filled 2023-03-12 (×5): qty 5

## 2023-03-12 MED ORDER — ENOXAPARIN SODIUM 40 MG/0.4ML IJ SOSY
40.0000 mg | PREFILLED_SYRINGE | INTRAMUSCULAR | Status: DC
  Administered 2023-03-12 – 2023-03-13 (×2): 40 mg via SUBCUTANEOUS
  Filled 2023-03-12 (×2): qty 0.4

## 2023-03-12 MED ORDER — AMIODARONE HCL 200 MG PO TABS
400.0000 mg | ORAL_TABLET | Freq: Every day | ORAL | Status: DC
Start: 2023-03-12 — End: 2023-03-20
  Administered 2023-03-12 – 2023-03-20 (×9): 400 mg via ORAL
  Filled 2023-03-12 (×10): qty 2

## 2023-03-12 MED ORDER — PANTOPRAZOLE SODIUM 40 MG PO TBEC
80.0000 mg | DELAYED_RELEASE_TABLET | Freq: Every day | ORAL | Status: DC
Start: 2023-03-12 — End: 2023-03-20
  Administered 2023-03-12 – 2023-03-20 (×9): 80 mg via ORAL
  Filled 2023-03-12 (×9): qty 2

## 2023-03-12 MED ORDER — TAMSULOSIN HCL 0.4 MG PO CAPS
0.4000 mg | ORAL_CAPSULE | Freq: Every day | ORAL | Status: DC
Start: 2023-03-12 — End: 2023-03-20
  Administered 2023-03-12 – 2023-03-20 (×9): 0.4 mg via ORAL
  Filled 2023-03-12 (×9): qty 1

## 2023-03-12 MED ORDER — MONTELUKAST SODIUM 10 MG PO TABS
10.0000 mg | ORAL_TABLET | Freq: Every day | ORAL | Status: DC
Start: 2023-03-12 — End: 2023-03-20
  Administered 2023-03-12 – 2023-03-17 (×6): 10 mg via ORAL
  Filled 2023-03-12 (×6): qty 1

## 2023-03-12 MED ORDER — ALPRAZOLAM 0.5 MG PO TABS
0.5000 mg | ORAL_TABLET | Freq: Every day | ORAL | Status: DC | PRN
  Administered 2023-03-13 – 2023-03-18 (×3): 0.5 mg via ORAL
  Filled 2023-03-12 (×4): qty 1

## 2023-03-12 MED ORDER — AZELASTINE HCL 0.1 % NA SOLN
1.0000 | Freq: Two times a day (BID) | NASAL | Status: DC
  Administered 2023-03-12 – 2023-03-20 (×12): 1 via NASAL
  Filled 2023-03-12 (×3): qty 30

## 2023-03-12 MED ORDER — HYDROMORPHONE HCL 1 MG/ML IJ SOLN
1.0000 mg | Freq: Once | INTRAMUSCULAR | Status: AC
  Administered 2023-03-12: 1 mg via INTRAVENOUS
  Filled 2023-03-12: qty 1

## 2023-03-12 MED ORDER — SODIUM CHLORIDE 0.9 % IV SOLN
2.0000 g | INTRAVENOUS | Status: DC
  Administered 2023-03-12 – 2023-03-13 (×2): 2 g via INTRAVENOUS
  Filled 2023-03-12 (×2): qty 20

## 2023-03-12 MED ORDER — CLONIDINE HCL (ANALGESIA) 100 MCG/ML EP SOLN
EPIDURAL | Status: DC | PRN
  Administered 2023-03-12: 50 ug

## 2023-03-12 MED ORDER — ALBUTEROL SULFATE (2.5 MG/3ML) 0.083% IN NEBU: 2.5000 mg | INHALATION_SOLUTION | Freq: Four times a day (QID) | RESPIRATORY_TRACT | Status: DC | PRN

## 2023-03-12 MED ORDER — DEXAMETHASONE SODIUM PHOSPHATE 4 MG/ML IJ SOLN
INTRAMUSCULAR | Status: DC | PRN
  Administered 2023-03-12: 8 mg via PERINEURAL

## 2023-03-12 MED ORDER — FLUTICASONE FUROATE-VILANTEROL 100-25 MCG/ACT IN AEPB
1.0000 | INHALATION_SPRAY | Freq: Every day | RESPIRATORY_TRACT | Status: DC
  Administered 2023-03-13 – 2023-03-20 (×7): 1 via RESPIRATORY_TRACT
  Filled 2023-03-12 (×2): qty 28

## 2023-03-12 MED ORDER — HYDROMORPHONE HCL 1 MG/ML IJ SOLN
0.5000 mg | INTRAMUSCULAR | Status: DC | PRN
  Administered 2023-03-12 – 2023-03-14 (×9): 0.5 mg via INTRAVENOUS
  Filled 2023-03-12 (×2): qty 0.5
  Filled 2023-03-12: qty 1
  Filled 2023-03-12 (×2): qty 0.5
  Filled 2023-03-12: qty 1
  Filled 2023-03-12 (×2): qty 0.5
  Filled 2023-03-12: qty 1

## 2023-03-12 MED ORDER — ASPIRIN 81 MG PO CHEW
81.0000 mg | CHEWABLE_TABLET | Freq: Every day | ORAL | Status: DC
  Administered 2023-03-12 – 2023-03-13 (×2): 81 mg via ORAL
  Filled 2023-03-12 (×2): qty 1

## 2023-03-12 MED ORDER — UMECLIDINIUM BROMIDE 62.5 MCG/ACT IN AEPB
1.0000 | INHALATION_SPRAY | Freq: Every day | RESPIRATORY_TRACT | Status: DC
  Administered 2023-03-13 – 2023-03-20 (×7): 1 via RESPIRATORY_TRACT
  Filled 2023-03-12 (×2): qty 7

## 2023-03-12 NOTE — Assessment & Plan Note (Signed)
Cont amiodarone and coreg Holding pradaxa due to need for surgery

## 2023-03-12 NOTE — ED Notes (Signed)
PT transported to procedure for pain block

## 2023-03-12 NOTE — Assessment & Plan Note (Signed)
Pt with new O2 requirement, apparent multifocal PNA on CT chest today.

## 2023-03-12 NOTE — Consult Note (Signed)
Reason for Consult:Right hip fx Referring Physician: Maurilio Lovely Time called: 0802 Time at bedside: 1028   Jonathan Mathews is an 89 y.o. male.  HPI: Jonathan Mathews was going to get the mail last night when he slipped on some ice and fell. He had immediate right hip pain and could not get up. He was brought to the ED where x-rays showed a right hip fx and orthopedic surgery was consulted. He lives at home with his wife and does not use any assistive devices to ambulate.  Past Medical History:  Diagnosis Date   Atrial fibrillation (HCC)    BPH (benign prostatic hyperplasia)    Coronary artery disease    Environmental allergies    GERD (gastroesophageal reflux disease)    High cholesterol    Hypertension    Ventricular tachycardia (HCC)     Past Surgical History:  Procedure Laterality Date   APPENDECTOMY     TONSILLECTOMY      History reviewed. No pertinent family history.  Social History:  reports that he has quit smoking. He has never used smokeless tobacco. He reports that he does not drink alcohol and does not use drugs.  Allergies:  Allergies  Allergen Reactions   Clindamycin/Lincomycin Diarrhea   Doxycycline Nausea And Vomiting    Gi upset    Pravastatin     Urinary retention   Zocor [Simvastatin]     myalgia   Ivp Dye [Iodinated Contrast Media] Rash    Medications: I have reviewed the patient's current medications.  Results for orders placed or performed during the hospital encounter of 03/11/23 (from the past 48 hours)  Brain natriuretic peptide     Status: Abnormal   Collection Time: 03/11/23  9:40 PM  Result Value Ref Range   B Natriuretic Peptide 185.3 (H) 0.0 - 100.0 pg/mL    Comment: Performed at [facility], [address] [place], [place] [address]  Comprehensive metabolic panel     Status: Abnormal   Collection Time: 03/11/23  9:48 PM  Result Value Ref Range   Sodium 137 135 - 145 mmol/L   Potassium 3.6 3.5 - 5.1 mmol/L   Chloride 107 98 - 111  mmol/L   CO2 22 22 - 32 mmol/L   Glucose, Bld 138 (H) 70 - 99 mg/dL    Comment: Glucose reference range applies only to samples taken after fasting for at least 8 hours.   BUN 23 8 - 23 mg/dL   Creatinine, Ser 9.14 (H) 0.61 - 1.24 mg/dL   Calcium 8.6 (L) 8.9 - 10.3 mg/dL   Total Protein 5.8 (L) 6.5 - 8.1 g/dL   Albumin 3.4 (L) 3.5 - 5.0 g/dL   AST 39 15 - 41 U/L   ALT 44 0 - 44 U/L   Alkaline Phosphatase 80 38 - 126 U/L   Total Bilirubin 1.5 (H) 0.0 - 1.2 mg/dL   GFR, Estimated 34 (L) >60 mL/min    Comment: (NOTE) Calculated using the CKD-EPI Creatinine Equation (2021)    Anion gap 8 5 - 15    Comment: Performed at [facility], [address], [place], [place] [address]  CBC     Status: Abnormal   Collection Time: 03/11/23  9:48 PM  Result Value Ref Range   WBC 11.6 (H) 4.0 - 10.5 K/uL   RBC 4.36 4.22 - 5.81 MIL/uL   Hemoglobin 14.1 13.0 - 17.0 g/dL   HCT 62.1 30.8 - 65.7 %   MCV 99.1  80.0 - 100.0 fL   MCH 32.3 26.0 - 34.0 pg   MCHC 32.6 30.0 - 36.0 g/dL   RDW 16.1 (H) 09.6 - 04.5 %   Platelets 269 150 - 400 K/uL   nRBC 0.0 0.0 - 0.2 %    Comment: Performed at [facility], [address], [place], [place] [address]  Ethanol     Status: None   Collection Time: 03/11/23  9:48 PM  Result Value Ref Range   Alcohol, Ethyl (B) <10 <10 mg/dL    Comment: (NOTE) Lowest detectable limit for serum alcohol is 10 mg/dL.  For medical purposes only. Performed at [facility], [address] [place], [place] [address]   Protime-INR     Status: None   Collection Time: 03/11/23  9:48 PM  Result Value Ref Range   Prothrombin Time 14.4 11.4 - 15.2 seconds   INR 1.1 0.8 - 1.2    Comment: (NOTE) INR goal varies based on device and disease states. Performed at [facility], [address] [place], [place] [address]   Sample to Blood Bank     Status: None   Collection Time: 03/11/23  9:48 PM  Result Value Ref Range   Blood Bank Specimen SAMPLE AVAILABLE  FOR TESTING    Sample Expiration      03/14/2023,2359 Performed at [facility], [address], [place], [place] [address]   Type and screen [facility]     Status: None   Collection Time: 03/11/23  9:53 PM  Result Value Ref Range   ABO/RH(D) O POS    Antibody Screen NEG    Sample Expiration      03/14/2023,2359 Performed at [facility], [address], [place], [place] [address]   I-Stat Chem 8, ED     Status: Abnormal   Collection Time: 03/11/23 10:02 PM  Result Value Ref Range   Sodium 141 135 - 145 mmol/L   Potassium 3.9 3.5 - 5.1 mmol/L   Chloride 106 98 - 111 mmol/L   BUN 30 (H) 8 - 23 mg/dL   Creatinine, Ser 6.96 (H) 0.61 - 1.24 mg/dL   Glucose, Bld 295 (H) 70 - 99 mg/dL    Comment: Glucose reference range applies only to samples taken after fasting for at least 8 hours.   Calcium, Ion 1.13 (L) 1.15 - 1.40 mmol/L   TCO2 24 22 - 32 mmol/L   Hemoglobin 15.0 13.0 - 17.0 g/dL   HCT 28.4 13.2 - 44.0 %  I-Stat Lactic Acid, ED     Status: None   Collection Time: 03/11/23 10:02 PM  Result Value Ref Range   Lactic Acid, Venous 1.1 0.5 - 1.9 mmol/L  Troponin I (High Sensitivity)     Status: None   Collection Time: 03/11/23 10:03 PM  Result Value Ref Range   Troponin I (High Sensitivity) 15 <18 ng/L    Comment: (NOTE) Elevated high sensitivity troponin I (hsTnI) values and significant  changes across serial measurements may suggest ACS but many other  chronic and acute conditions are known to elevate hsTnI results.  Refer to the "Links" section for chest pain algorithms and additional  guidance. Performed at [facility], [address], [place], [place] [address]   Troponin I (High Sensitivity)     Status: Abnormal   Collection Time: 03/11/23 11:59 PM  Result Value Ref Range   Troponin I (High Sensitivity) 26 (H) <  18 ng/L    Comment: (NOTE) Elevated high sensitivity troponin I (hsTnI) values and significant  changes across serial  measurements may suggest ACS but many other  chronic and acute conditions are known to elevate hsTnI results.  Refer to the "Links" section for chest pain algorithms and additional  guidance. Performed at [facility], [address], [place], [place] [address]   HIV Antibody (routine testing w rflx)     Status: None   Collection Time: 03/12/23 12:17 AM  Result Value Ref Range   HIV Screen 4th Generation wRfx Non Reactive Non Reactive    Comment: Performed at [facility], [address] [place], [place] [address]  Urinalysis, Routine w reflex microscopic -Urine, Clean Catch     Status: Abnormal   Collection Time: 03/12/23  3:45 AM  Result Value Ref Range   Color, Urine YELLOW YELLOW   APPearance CLEAR CLEAR   Specific Gravity, Urine 1.016 1.005 - 1.030   pH 5.0 5.0 - 8.0   Glucose, UA NEGATIVE NEGATIVE mg/dL   Hgb urine dipstick NEGATIVE NEGATIVE   Bilirubin Urine NEGATIVE NEGATIVE   Ketones, ur 5 (A) NEGATIVE mg/dL   Protein, ur NEGATIVE NEGATIVE mg/dL   Nitrite NEGATIVE NEGATIVE   Leukocytes,Ua NEGATIVE NEGATIVE    Comment: Performed at [facility], [address], [place], [place] [address]  Resp panel by RT-PCR (RSV, Flu A&B, Covid) Urine, Clean Catch     Status: None   Collection Time: 03/12/23  3:45 AM   Specimen: Urine, Clean Catch; Nasal Swab  Result Value Ref Range   SARS Coronavirus 2 by RT PCR NEGATIVE NEGATIVE   Influenza A by PCR NEGATIVE NEGATIVE   Influenza B by PCR NEGATIVE NEGATIVE    Comment: (NOTE) The Xpert Xpress SARS-CoV-2/FLU/RSV plus assay is intended as an aid in the diagnosis of influenza from Nasopharyngeal swab specimens and should not be used as a sole basis for treatment. Nasal washings and aspirates are unacceptable for Xpert Xpress SARS-CoV-2/FLU/RSV testing.  Fact Sheet for Patients: BloggerCourse.com  Fact Sheet for Healthcare Providers: SeriousBroker.it  This test is  not yet approved or cleared by the [place] FDA and has been authorized for detection and/or diagnosis of SARS-CoV-2 by FDA under an Emergency Use Authorization (EUA). This EUA will remain in effect (meaning this test can be used) for the duration of the COVID-19 declaration under Section 564(b)(1) of the Act, 21 U.S.C. section 360bbb-3(b)(1), unless the authorization is terminated or revoked.     Resp Syncytial Virus by PCR NEGATIVE NEGATIVE    Comment: (NOTE) Fact Sheet for Patients: BloggerCourse.com  Fact Sheet for Healthcare Providers: SeriousBroker.it  This test is not yet approved or cleared by the [place] FDA and has been authorized for detection and/or diagnosis of SARS-CoV-2 by FDA under an Emergency Use Authorization (EUA). This EUA will remain in effect (meaning this test can be used) for the duration of the COVID-19 declaration under Section 564(b)(1) of the Act, 21 U.S.C. section 360bbb-3(b)(1), unless the authorization is terminated or revoked.  Performed at [facility], [address], [place], [place] [address]   Respiratory (~20 pathogens) panel by PCR     Status: None   Collection Time: 03/12/23  6:24 AM   Specimen: Nasopharyngeal Swab; Respiratory  Result Value Ref Range   Adenovirus NOT DETECTED NOT DETECTED   Coronavirus 229E NOT DETECTED NOT DETECTED    Comment: (NOTE) The Coronavirus on the Respiratory Panel,  DOES NOT test for the novel  Coronavirus (2019 nCoV)    Coronavirus HKU1 NOT DETECTED NOT DETECTED   Coronavirus NL63 NOT DETECTED NOT DETECTED   Coronavirus OC43 NOT DETECTED NOT DETECTED   Metapneumovirus NOT DETECTED NOT DETECTED   Rhinovirus / Enterovirus NOT DETECTED NOT DETECTED   Influenza A NOT DETECTED NOT DETECTED   Influenza B NOT DETECTED NOT DETECTED   Parainfluenza Virus 1 NOT DETECTED NOT DETECTED   Parainfluenza Virus 2 NOT DETECTED NOT DETECTED    Parainfluenza Virus 3 NOT DETECTED NOT DETECTED   Parainfluenza Virus 4 NOT DETECTED NOT DETECTED   Respiratory Syncytial Virus NOT DETECTED NOT DETECTED   Bordetella pertussis NOT DETECTED NOT DETECTED   Bordetella Parapertussis NOT DETECTED NOT DETECTED   Chlamydophila pneumoniae NOT DETECTED NOT DETECTED   Mycoplasma pneumoniae NOT DETECTED NOT DETECTED    Comment: Performed at [facility], [address] [place], [place] [address]  SARS Coronavirus 2 by RT PCR (hospital order, performed in [facility] hospital lab) *cepheid single result test* Nasopharyngeal Swab     Status: None   Collection Time: 03/12/23  6:24 AM   Specimen: Nasopharyngeal Swab; Nasal Swab  Result Value Ref Range   SARS Coronavirus 2 by RT PCR NEGATIVE NEGATIVE    Comment: Performed at [facility], [address], [place], [place] [address]  ABO/Rh     Status: None   Collection Time: 03/12/23  6:24 AM  Result Value Ref Range   ABO/RH(D)      O POS Performed at [facility], [address], [place], [place] [address]   Strep pneumoniae urinary antigen     Status: None   Collection Time: 03/12/23  6:43 AM  Result Value Ref Range   Strep Pneumo Urinary Antigen NEGATIVE NEGATIVE    Comment:        Infection due to S. pneumoniae cannot be absolutely ruled out since the antigen present may be below the detection limit of the test. Performed at [facility], [address] [place], [place] [address]     CT CHEST ABDOMEN PELVIS WO CONTRAST Result Date: 03/11/2023 CLINICAL DATA:  Polytrauma, blunt EXAM: CT CHEST, ABDOMEN AND PELVIS WITHOUT CONTRAST TECHNIQUE: Multidetector CT imaging of the chest, abdomen and pelvis was performed following the standard protocol without IV contrast. RADIATION DOSE REDUCTION: This exam was performed according to the departmental dose-optimization program which includes automated exposure control, adjustment of the mA and/or kV according to patient size  and/or use of iterative reconstruction technique. COMPARISON:  None Available. FINDINGS: CHEST: Cardiovascular: The thoracic aorta is normal in caliber. The heart is normal in size. No significant pericardial effusion. Four-vessel coronary calcification. Lungs/Pleura: Centrilobular and paraseptal mild-to-moderate emphysematous changes. Diffuse bronchial wall thickening. Dependent bilateral upper and lower lung zones patchy airspace opacities. No pulmonary nodule. No pulmonary mass. No pulmonary contusion or laceration. No pneumatocele formation. No pleural effusion. No pneumothorax. No hemothorax. Mediastinum/Nodes: No pneumomediastinum. The central airways are patent. The esophagus is unremarkable.  Small hiatal hernia. The thyroid is unremarkable. Limited evaluation for hilar lymphadenopathy on this noncontrast study. No mediastinal or axillary lymphadenopathy. Musculoskeletal/Chest wall No chest wall mass.  Bilateral gynecomastia. No acute rib or sternal fracture. No spinal fracture. ABDOMEN / PELVIS: Hepatobiliary: Not enlarged. No focal lesion. The gallbladder is otherwise unremarkable with no radio-opaque gallstones. No biliary ductal dilatation. Pancreas: Normal pancreatic contour. No main pancreatic duct dilatation. Spleen: Not enlarged. No focal lesion. Adrenals/Urinary Tract:  No nodularity bilaterally. No hydroureteronephrosis. No nephroureterolithiasis. No contour deforming renal mass. The urinary bladder is unremarkable. Stomach/Bowel: No small or large bowel wall thickening or dilatation. Colonic diverticulosis. The appendix is unremarkable. Vasculature/Lymphatic: Severe atherosclerotic plaque. No abdominal aorta or iliac aneurysm. No abdominal, pelvic, inguinal lymphadenopathy. Reproductive: Normal. Other: No simple free fluid ascites. No pneumoperitoneum. No mesenteric hematoma identified. No organized fluid collection. Musculoskeletal: No significant soft tissue hematoma. Acute displaced, comminuted,  impacted right femoral neck fracture. No spinal fracture. Multilevel degenerative changes and intervertebral disc space vacuum phenomenon. Ports and Devices: None. IMPRESSION: 1. No acute intrathoracic, intra-abdominal, intrapelvic traumatic injury. 2. No acute fracture or traumatic malalignment of the thoracic or lumbar spine. 3. Acute displaced, comminuted, impacted right femoral neck fracture. 4. Diffuse bronchial wall thickening. Dependent bilateral upper and lower lung zones patchy airspace opacities. Findings could represent developing infection/inflammation (aspiration pneumonia). Recommend follow up CT in 3 months to evaluate for complete resolution. 5. Other imaging findings of potential clinical significance: Small hiatal hernia. Colonic diverticulosis with no acute diverticulitis. Aortic Atherosclerosis (ICD10-I70.0) including four-vessel coronary calcifications. 6. Emphysema (ICD10-J43.9) . Electronically Signed   By: Tish Frederickson M.D.   On: 03/11/2023 23:40   CT HIP RIGHT WO CONTRAST Result Date: 03/11/2023 CLINICAL DATA:  Hip trauma, fracture suspected, xray done EXAM: CT OF THE RIGHT HIP WITHOUT CONTRAST TECHNIQUE: Multidetector CT imaging of the right hip was performed according to the standard protocol. Multiplanar CT image reconstructions were also generated. RADIATION DOSE REDUCTION: This exam was performed according to the departmental dose-optimization program which includes automated exposure control, adjustment of the mA and/or kV according to patient size and/or use of iterative reconstruction technique. COMPARISON:  CT abdomen pelvis 03/11/2023, x-ray right hip 03/11/2023 FINDINGS: Bones/Joint/Cartilage Acute displaced, comminuted, and impacted right femoral neck fracture. No dislocation. No acute fracture of the right pelvic bones. Ligaments Suboptimally assessed by CT. Muscles and Tendons Grossly unremarkable. Soft tissues Right hip subcutaneus soft tissue edema/hematoma formation.  Other: Colonic diverticulosis.  Atherosclerotic plaque. IMPRESSION: 1. Acute displaced, comminuted, and impacted right femoral neck fracture. 2.  Aortic Atherosclerosis (ICD10-I70.0). Electronically Signed   By: Tish Frederickson M.D.   On: 03/11/2023 23:04   CT HEAD WO CONTRAST Result Date: 03/11/2023 CLINICAL DATA:  Head trauma, moderate-severe; Polytrauma, blunt. Fall EXAM: CT HEAD WITHOUT CONTRAST CT CERVICAL SPINE WITHOUT CONTRAST TECHNIQUE: Multidetector CT imaging of the head and cervical spine was performed following the standard protocol without intravenous contrast. Multiplanar CT image reconstructions of the cervical spine were also generated. RADIATION DOSE REDUCTION: This exam was performed according to the departmental dose-optimization program which includes automated exposure control, adjustment of the mA and/or kV according to patient size and/or use of iterative reconstruction technique. COMPARISON:  None Available. FINDINGS: CT HEAD FINDINGS Brain: Cerebral ventricle sizes are concordant with the degree of cerebral volume loss. Patchy and confluent areas of decreased attenuation are noted throughout the deep and periventricular white matter of the cerebral hemispheres bilaterally, compatible with chronic microvascular ischemic disease. No evidence of large-territorial acute infarction. No parenchymal hemorrhage. No mass lesion. No extra-axial collection. No mass effect or midline shift. No hydrocephalus. Basilar cisterns are patent. Vascular: No hyperdense vessel. Atherosclerotic calcifications are present within the cavernous internal carotid and vertebral arteries. Skull: No acute fracture or focal lesion. Sinuses/Orbits: Left frontal, bilateral ethmoid, bilateral maxillary sinus mucosal thickening. Otherwise paranasal sinuses and mastoid air cells are clear. Right lens replacement. Otherwise the orbits are unremarkable. Other: None. CT CERVICAL SPINE FINDINGS Alignment: Normal. Skull  base and  vertebrae: Multilevel moderate degenerative changes of the spine. No acute fracture. No aggressive appearing focal osseous lesion or focal pathologic process. Soft tissues and spinal canal: No prevertebral fluid or swelling. No visible canal hematoma. Upper chest: Biapical patchy airspace opacities. Emphysematous changes. Other: Atherosclerotic plaque of the aortic arch and its branches. IMPRESSION: 1. No acute intracranial abnormality. 2. No acute displaced fracture or traumatic listhesis of the cervical spine. 3. Sinus disease. 4. Biapical patchy airspace opacities. Please see separately dictated CT chest 03/11/2023. 5. Aortic Atherosclerosis (ICD10-I70.0) and Emphysema (ICD10-J43.9). Electronically Signed   By: Tish Frederickson M.D.   On: 03/11/2023 23:02   CT CERVICAL SPINE WO CONTRAST Result Date: 03/11/2023 CLINICAL DATA:  Head trauma, moderate-severe; Polytrauma, blunt. Fall EXAM: CT HEAD WITHOUT CONTRAST CT CERVICAL SPINE WITHOUT CONTRAST TECHNIQUE: Multidetector CT imaging of the head and cervical spine was performed following the standard protocol without intravenous contrast. Multiplanar CT image reconstructions of the cervical spine were also generated. RADIATION DOSE REDUCTION: This exam was performed according to the departmental dose-optimization program which includes automated exposure control, adjustment of the mA and/or kV according to patient size and/or use of iterative reconstruction technique. COMPARISON:  None Available. FINDINGS: CT HEAD FINDINGS Brain: Cerebral ventricle sizes are concordant with the degree of cerebral volume loss. Patchy and confluent areas of decreased attenuation are noted throughout the deep and periventricular white matter of the cerebral hemispheres bilaterally, compatible with chronic microvascular ischemic disease. No evidence of large-territorial acute infarction. No parenchymal hemorrhage. No mass lesion. No extra-axial collection. No mass effect or midline  shift. No hydrocephalus. Basilar cisterns are patent. Vascular: No hyperdense vessel. Atherosclerotic calcifications are present within the cavernous internal carotid and vertebral arteries. Skull: No acute fracture or focal lesion. Sinuses/Orbits: Left frontal, bilateral ethmoid, bilateral maxillary sinus mucosal thickening. Otherwise paranasal sinuses and mastoid air cells are clear. Right lens replacement. Otherwise the orbits are unremarkable. Other: None. CT CERVICAL SPINE FINDINGS Alignment: Normal. Skull base and vertebrae: Multilevel moderate degenerative changes of the spine. No acute fracture. No aggressive appearing focal osseous lesion or focal pathologic process. Soft tissues and spinal canal: No prevertebral fluid or swelling. No visible canal hematoma. Upper chest: Biapical patchy airspace opacities. Emphysematous changes. Other: Atherosclerotic plaque of the aortic arch and its branches. IMPRESSION: 1. No acute intracranial abnormality. 2. No acute displaced fracture or traumatic listhesis of the cervical spine. 3. Sinus disease. 4. Biapical patchy airspace opacities. Please see separately dictated CT chest 03/11/2023. 5. Aortic Atherosclerosis (ICD10-I70.0) and Emphysema (ICD10-J43.9). Electronically Signed   By: Tish Frederickson M.D.   On: 03/11/2023 23:02   DG Chest Port 1 View Result Date: 03/11/2023 CLINICAL DATA:  Trauma. EXAM: PORTABLE CHEST 1 VIEW COMPARISON:  Radiograph 02/16/2023 FINDINGS: Lung volumes are low. Stable heart size and mediastinal contours. There are diffuse interstitial opacities are new from prior exam, suspicious for pulmonary edema. Possible left pleural effusion. No convincing pneumothorax skull multiple moderate overlying artifacts. On limited assessment, no displaced rib fracture. IMPRESSION: Low lung volumes. Diffuse interstitial opacities suspicious for pulmonary edema. Possible small left pleural effusion. Electronically Signed   By: Narda Rutherford M.D.   On:  03/11/2023 22:14   DG Pelvis Portable Result Date: 03/11/2023 CLINICAL DATA:  Blunt trauma, pain. EXAM: RIGHT FEMUR PORTABLE 1 VIEW; PORTABLE PELVIS 1-2 VIEWS COMPARISON:  None Available. FINDINGS: Pelvis: Displaced right femoral neck fracture. Proximal migration of the femoral shaft. No additional pelvic fracture. Pubic rami are intact. Pubic symphysis and sacroiliac joints are  congruent. Femur: Frontal views of the right femur obtained. Displaced femoral neck fracture with proximal migration of the femoral shaft. The distal femur is intact. Distal most femur including the knee not included in the field of view. IMPRESSION: Displaced right femoral neck fracture. Electronically Signed   By: Narda Rutherford M.D.   On: 03/11/2023 22:13   DG FEMUR PORT, 1V RIGHT Result Date: 03/11/2023 CLINICAL DATA:  Blunt trauma, pain. EXAM: RIGHT FEMUR PORTABLE 1 VIEW; PORTABLE PELVIS 1-2 VIEWS COMPARISON:  None Available. FINDINGS: Pelvis: Displaced right femoral neck fracture. Proximal migration of the femoral shaft. No additional pelvic fracture. Pubic rami are intact. Pubic symphysis and sacroiliac joints are congruent. Femur: Frontal views of the right femur obtained. Displaced femoral neck fracture with proximal migration of the femoral shaft. The distal femur is intact. Distal most femur including the knee not included in the field of view. IMPRESSION: Displaced right femoral neck fracture. Electronically Signed   By: Narda Rutherford M.D.   On: 03/11/2023 22:13    Review of Systems  HENT:  Negative for ear discharge, ear pain, hearing loss and tinnitus.   Eyes:  Negative for photophobia and pain.  Respiratory:  Negative for cough and shortness of breath.   Cardiovascular:  Negative for chest pain.  Gastrointestinal:  Negative for abdominal pain, nausea and vomiting.  Genitourinary:  Negative for dysuria, flank pain, frequency and urgency.  Musculoskeletal:  Positive for arthralgias (Right hip). Negative for  back pain, myalgias and neck pain.  Neurological:  Negative for dizziness and headaches.  Hematological:  Does not bruise/bleed easily.  Psychiatric/Behavioral:  The patient is not nervous/anxious.    Blood pressure 131/78, pulse 79, temperature 98.8 F (37.1 C), temperature source Oral, resp. rate 19, height 5\' 8"  (1.727 m), weight 77.1 kg, SpO2 95%. Physical Exam Constitutional:      General: He is not in acute distress.    Appearance: He is well-developed. He is not diaphoretic.  HENT:     Head: Normocephalic and atraumatic.  Eyes:     General: No scleral icterus.       Right eye: No discharge.        Left eye: No discharge.     Conjunctiva/sclera: Conjunctivae normal.  Cardiovascular:     Rate and Rhythm: Normal rate and regular rhythm.  Pulmonary:     Effort: Pulmonary effort is normal. No respiratory distress.  Musculoskeletal:     Cervical back: Normal range of motion.     Comments: RLE No traumatic wounds, ecchymosis, or rash  Mod TTP hip  No knee or ankle effusion  Knee stable to varus/ valgus and anterior/posterior stress  Sens DPN, SPN, TN intact  Motor EHL, ext, flex, evers 5/5  DP 2+, PT 1+, No significant edema  Skin:    General: Skin is warm and dry.  Neurological:     Mental Status: He is alert.  Psychiatric:        Mood and Affect: Mood normal.        Behavior: Behavior normal.    Assessment/Plan: Right hip fx -- Plan hip hemi vs THA, timing and surgeon TBD, likely Sunday or Monday. Delay necessary 2/2 Pradaxa use.    Freeman Caldron, PA-C Orthopedic Surgery [phone] 03/12/2023, 10:34 AM

## 2023-03-12 NOTE — ED Notes (Signed)
Called [facility] for pt transport, NO ETA first truck available.

## 2023-03-12 NOTE — Progress Notes (Signed)
Patient ID: Jonathan Mathews, male   DOB: [birth year], 89 y.o.   MRN: [id number]  Asked to assist with surgical management of his right hip fracture  Right hemi versus total hip replacement on Sunday at [facility] Please work to transfer to [facility] for procedure Sunday

## 2023-03-12 NOTE — Anesthesia Postprocedure Evaluation (Signed)
Anesthesia Post Note  Patient: Jonathan Mathews  Procedure(s) Performed: AN AD HOC NERVE BLOCK     Patient location during evaluation: PACU Anesthesia Type: Regional Level of consciousness: awake and alert Pain management: pain level controlled Vital Signs Assessment: post-procedure vital signs reviewed and stable Respiratory status: spontaneous breathing Cardiovascular status: stable Anesthetic complications: no   No notable events documented.  Last Vitals:  Vitals:   03/12/23 1245 03/12/23 1329  BP: 131/76 120/61  Pulse:  75  Resp:  (!) 21  Temp:  36.5 C  SpO2: 93% 94%    Last Pain:  Vitals:   03/12/23 1329  TempSrc: Oral  PainSc: 5                  [name]

## 2023-03-12 NOTE — Assessment & Plan Note (Signed)
Cont home nebs Doesn't seem to be in exacerbation, pt asymptomatic despite O2 requirement and CT chest findings.

## 2023-03-12 NOTE — Consult Note (Addendum)
Cardiology Consultation   Patient ID: Jonathan Mathews MRN: [id number]; DOB: 05/13/1933  Admit date: 03/11/2023 Date of Consult: 03/12/2023  PCP:  Toy Care, DO   [facility] Providers Cardiologist:  None [facility]  Patient Profile:   Jonathan Mathews is a 89 y.o. male with a hx of nonischemic cardiomyopathy with reduced EF, 14% PVC burden, nonobstructive CAD, recent TIA, hypertension, CKD, COPD, who is being seen 03/12/2023 for the evaluation of preoperative evaluation for hip fracture at the request of Dr. Sherryll Burger.  History of Present Illness:   Jonathan Mathews is followed by [facility] and has history of reduced EF around 30 to 35% with akinesis of the basal inferior and inferoseptal segment, pattern consistent with prior infarct.  Also had previous heart monitor that demonstrated frequent PVCs/NSVT.  PVC burden was approximately 14%.  Ectopy was felt to be possibly related to his decrease in EF and recently underwent cardiac catheterization in November 2024 that did not show any significant obstructive disease.  He was not felt to be a candidate for ablation so he was titrated up on his amiodarone, now taking 400 mg daily and reports having improvement off of this.  Also on chart review there is some reported A-fib however given no documented recurrences of this and their health system he has been off his DOAC.  Additionally, he is on aspirin and Plavix currently for possible TIA in December.    Currently patient being evaluated for fall secondary to slipping on the ice also being treated for multifocal pneumonia requiring new oxygen requirements.  He sustained a right femoral neck fracture.  Cardiology asked to preoperatively evaluate him.  Overall patient reports being very functional at home.  He walks daily around 25 to 30 minutes every day without any associated symptoms of chest pain, shortness of breath, palpitations, dizziness, syncope, fatigue.  He does not struggle with  peripheral edema, orthopnea.  Reports no significant complaints despite being on supplemental oxygen.  Seems to be a little bit weak possibly drowsier and although.  Reports compliancy with his medications.  Past Medical History:  Diagnosis Date   Atrial fibrillation (HCC)    BPH (benign prostatic hyperplasia)    Coronary artery disease    Environmental allergies    GERD (gastroesophageal reflux disease)    High cholesterol    Hypertension    Ventricular tachycardia (HCC)     Past Surgical History:  Procedure Laterality Date   APPENDECTOMY     TONSILLECTOMY      Inpatient Medications: Scheduled Meds:  amiodarone  400 mg Oral Daily   aspirin  81 mg Oral Daily   azelastine  1 spray Each Nare BID   carvedilol  6.25 mg Oral BID WC   clopidogrel  75 mg Oral Daily   enoxaparin (LOVENOX) injection  40 mg Subcutaneous Q24H   fluticasone furoate-vilanterol  1 puff Inhalation Daily   And   umeclidinium bromide  1 puff Inhalation Daily   montelukast  10 mg Oral QHS   pantoprazole  80 mg Oral Daily   rosuvastatin  10 mg Oral Daily   tamsulosin  0.4 mg Oral Daily   Continuous Infusions:  [START ON 03/13/2023] azithromycin     cefTRIAXone (ROCEPHIN)  IV     PRN Meds: albuterol, ALPRAZolam, HYDROcodone-acetaminophen, HYDROmorphone (DILAUDID) injection, polyethylene glycol  Allergies:    Allergies  Allergen Reactions   Clindamycin/Lincomycin Diarrhea   Doxycycline Nausea And Vomiting    Gi upset  Pravastatin     Urinary retention   Zocor [Simvastatin]     myalgia   Ivp Dye [Iodinated Contrast Media] Rash    Social History:   Social History   Socioeconomic History   Marital status: Married    Spouse name: Not on file   Number of children: Not on file   Years of education: Not on file   Highest education level: Not on file  Occupational History   Not on file  Tobacco Use   Smoking status: Former   Smokeless tobacco: Never  Substance and Sexual Activity   Alcohol  use: No   Drug use: No   Sexual activity: Not on file  Other Topics Concern   Not on file  Social History Narrative   Not on file   Social Drivers of Health   Financial Resource Strain: Not on file  Food Insecurity: Low Risk  (01/04/2023)   Received from [facility]   Hunger Vital Sign    Worried About Running Out of Food in the Last Year: Never true    Ran Out of Food in the Last Year: Never true  Transportation Needs: No Transportation Needs (01/04/2023)   Received from [facility]    In the past 12 months, has lack of reliable transportation kept you from medical appointments, meetings, work or from getting things needed for daily living? : No  Physical Activity: Not on file  Stress: Not on file  Social Connections: Not on file  Intimate Partner Violence: Not on file    Family History:  History reviewed. No pertinent family history.   ROS:  Please see the history of present illness.  All other ROS reviewed and negative.     Physical Exam/Data:   Vitals:   03/12/23 0445 03/12/23 0530 03/12/23 0545 03/12/23 0645  BP: 111/64 102/77 115/66 129/71  Pulse: 76 76 75 79  Resp: 16 20 17 18   Temp:  97.8 F (36.6 C)    TempSrc:  Oral    SpO2: 97% 98% 97% 98%  Weight:      Height:        Intake/Output Summary (Last 24 hours) at 03/12/2023 0735 Last data filed at 03/12/2023 0156 Gross per 24 hour  Intake 1450 ml  Output --  Net 1450 ml      03/11/2023    9:58 PM 07/31/2018    1:46 PM 06/01/2012    6:19 AM  Last 3 Weights  Weight (lbs) 170 lb 171 lb 165 lb  Weight (kg) 77.111 kg 77.565 kg 74.844 kg     Body mass index is 25.85 kg/m.  General:  Well nourished, well developed, in no acute distress HEENT: normal Neck: no JVD Vascular: No carotid bruits; Distal pulses 2+ bilaterally Cardiac:  normal S1, S2; RRR; no murmur  Lungs: Only listened anteriorly, exam limited by pain.  But clear to auscultation anteriorly. Abd: soft, nontender, no  hepatomegaly  Ext: no edema Musculoskeletal:  No deformities, BUE and BLE strength normal and equal Skin: warm and dry  Neuro:  CNs 2-12 intact, no focal abnormalities noted Psych:  Normal affect   EKG:  The EKG was personally reviewed and demonstrates: Sinus rhythm, heart rate 74.  First-degree AV block with a PR of 246.  No acute ST-T wave changes.  QTc 504. Telemetry:  Telemetry was personally reviewed and demonstrates: Sinus rhythm, frequent PVCs.  Heart rate 70s  Relevant CV Studies: Echo 11/06/2022 There is no significant change  in comparison with the last study. There is normal left ventricular size and wall thickness. There is akinesis of the 'basal inferior and inferoseptal' left ventricular segments, consistent with prior infarct, and hypokinesis of the remaining left ventricular segments, consistent with left ventricular [ischemic] remodelling. Left ventricular systolic function is moderate to severely reduced. LV ejection fraction = 30?35%. There is mild, Grade I diastolic dysfunction, with indeterminate left atrial pressure. Left atrial pressure is indeterminate due to 'E/e' > 8 and < 14' The right ventricle is normal in size and function. The left atrium is mildly dilated. There is no significant valvular stenosis or regurgitation. There was insufficient TR detected to calculate RV systolic pressure. IVC size was normal. The aortic sinus is normal size.   Zio patch monitor 12/08/2022 71 NSVT, longest 15 beats, fastest 185 bpm (4 beats) and avg 117 bpm. Frequent isolated PVCs (14%) with rare couplets (2469) and triplets (443). First degree AVB present.   Zio patch monitor 06/20/2021 345 episodes of nonsustained ventricular tachycardia lasting 4-32 beats with average rate 120 bpm. 809 episodes of atrial tachycardia lasting 05-1733 beats with average rate 119 bpm.   Nuclear stress test 09/16/2020 Fixed inferior wall defect with associated wall motion abnormality compatible  with prior myocardial infarction.  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  The distal vessel is very tortuous with only minimal irregularities. RAMUS: large vessel with 40% proximal stenosis (technically the first diagonal) LCX: tortuous vessel that supplies a single small OM; free of significant disease. RCA: Very tortuous dominant vessel with mild ostial stenosis and scattered mild irregularities LVEDP is 19 mmHg; no significant LV-Ao gradient   No interventions have been documented.    Laboratory Data:  High Sensitivity Troponin:   Recent Labs  Lab 03/11/23 2203 03/11/23 2359  TROPONINIHS 15 26*     Chemistry Recent Labs  Lab 03/11/23 2148 03/11/23 2202  NA 137 141  K 3.6 3.9  CL 107 106  CO2 22  --   GLUCOSE 138* 135*  BUN 23 30*  CREATININE 1.88* 2.00*  CALCIUM 8.6*  --   GFRNONAA 34*  --   ANIONGAP 8  --     Recent Labs  Lab 03/11/23 2148  PROT 5.8*  ALBUMIN 3.4*  AST 39  ALT 44  ALKPHOS 80  BILITOT 1.5*   Lipids No results for input(s): "CHOL", "TRIG", "HDL", "LABVLDL", "LDLCALC", "CHOLHDL" in the last 168 hours.  Hematology Recent Labs  Lab 03/11/23 2148 03/11/23 2202  WBC 11.6*  --   RBC 4.36  --   HGB 14.1 15.0  HCT 43.2 44.0  MCV 99.1  --   MCH 32.3  --   MCHC 32.6  --   RDW 16.1*  --   PLT 269  --    Thyroid No results for input(s): "TSH", "FREET4" in the last 168 hours.  BNP Recent Labs  Lab 03/11/23 2140  BNP 185.3*    DDimer No results for input(s): "DDIMER" in the last 168 hours.   Radiology/Studies:  CT CHEST ABDOMEN PELVIS WO CONTRAST Result Date: 03/11/2023 CLINICAL DATA:  Polytrauma, blunt EXAM: CT CHEST, ABDOMEN AND PELVIS WITHOUT CONTRAST TECHNIQUE: Multidetector CT imaging of the  chest, abdomen and pelvis was performed following the standard protocol without  IV contrast. RADIATION DOSE REDUCTION: This exam was performed according to the departmental dose-optimization program which includes automated exposure control, adjustment of the mA and/or kV according to patient size and/or use of iterative reconstruction technique. COMPARISON:  None Available. FINDINGS: CHEST: Cardiovascular: The thoracic aorta is normal in caliber. The heart is normal in size. No significant pericardial effusion. Four-vessel coronary calcification. Lungs/Pleura: Centrilobular and paraseptal mild-to-moderate emphysematous changes. Diffuse bronchial wall thickening. Dependent bilateral upper and lower lung zones patchy airspace opacities. No pulmonary nodule. No pulmonary mass. No pulmonary contusion or laceration. No pneumatocele formation. No pleural effusion. No pneumothorax. No hemothorax. Mediastinum/Nodes: No pneumomediastinum. The central airways are patent. The esophagus is unremarkable.  Small hiatal hernia. The thyroid is unremarkable. Limited evaluation for hilar lymphadenopathy on this noncontrast study. No mediastinal or axillary lymphadenopathy. Musculoskeletal/Chest wall No chest wall mass.  Bilateral gynecomastia. No acute rib or sternal fracture. No spinal fracture. ABDOMEN / PELVIS: Hepatobiliary: Not enlarged. No focal lesion. The gallbladder is otherwise unremarkable with no radio-opaque gallstones. No biliary ductal dilatation. Pancreas: Normal pancreatic contour. No main pancreatic duct dilatation. Spleen: Not enlarged. No focal lesion. Adrenals/Urinary Tract: No nodularity bilaterally. No hydroureteronephrosis. No nephroureterolithiasis. No contour deforming renal mass. The urinary bladder is unremarkable. Stomach/Bowel: No small or large bowel wall thickening or dilatation. Colonic diverticulosis. The appendix is unremarkable. Vasculature/Lymphatic: Severe atherosclerotic plaque. No abdominal  aorta or iliac aneurysm. No abdominal, pelvic, inguinal lymphadenopathy. Reproductive: Normal. Other: No simple free fluid ascites. No pneumoperitoneum. No mesenteric hematoma identified. No organized fluid collection. Musculoskeletal: No significant soft tissue hematoma. Acute displaced, comminuted, impacted right femoral neck fracture. No spinal fracture. Multilevel degenerative changes and intervertebral disc space vacuum phenomenon. Ports and Devices: None. IMPRESSION: 1. No acute intrathoracic, intra-abdominal, intrapelvic traumatic injury. 2. No acute fracture or traumatic malalignment of the thoracic or lumbar spine. 3. Acute displaced, comminuted, impacted right femoral neck fracture. 4. Diffuse bronchial wall thickening. Dependent bilateral upper and lower lung zones patchy airspace opacities. Findings could represent developing infection/inflammation (aspiration pneumonia). Recommend follow up CT in 3 months to evaluate for complete resolution. 5. Other imaging findings of potential clinical significance: Small hiatal hernia. Colonic diverticulosis with no acute diverticulitis. Aortic Atherosclerosis (ICD10-I70.0) including four-vessel coronary calcifications. 6. Emphysema (ICD10-J43.9) . Electronically Signed   By: Tish Frederickson M.D.   On: 03/11/2023 23:40   CT HIP RIGHT WO CONTRAST Result Date: 03/11/2023 CLINICAL DATA:  Hip trauma, fracture suspected, xray done EXAM: CT OF THE RIGHT HIP WITHOUT CONTRAST TECHNIQUE: Multidetector CT imaging of the right hip was performed according to the standard protocol. Multiplanar CT image reconstructions were also generated. RADIATION DOSE REDUCTION: This exam was performed according to the departmental dose-optimization program which includes automated exposure control, adjustment of the mA and/or kV according to patient size and/or use of iterative reconstruction technique. COMPARISON:  CT abdomen pelvis 03/11/2023, x-ray right hip 03/11/2023 FINDINGS:  Bones/Joint/Cartilage Acute displaced, comminuted, and impacted right femoral neck fracture. No dislocation. No acute fracture of the right pelvic bones. Ligaments Suboptimally assessed by CT. Muscles and Tendons Grossly unremarkable. Soft tissues Right hip subcutaneus soft tissue edema/hematoma formation. Other: Colonic diverticulosis.  Atherosclerotic plaque. IMPRESSION: 1. Acute displaced, comminuted, and impacted right femoral neck fracture. 2.  Aortic Atherosclerosis (ICD10-I70.0). Electronically Signed   By: Tish Frederickson M.D.   On: 03/11/2023 23:04   CT HEAD WO CONTRAST Result Date: 03/11/2023 CLINICAL DATA:  Head trauma, moderate-severe; Polytrauma, blunt. Fall EXAM: CT HEAD WITHOUT CONTRAST CT CERVICAL SPINE WITHOUT CONTRAST TECHNIQUE: Multidetector  CT imaging of the head and cervical spine was performed following the standard protocol without intravenous contrast. Multiplanar CT image reconstructions of the cervical spine were also generated. RADIATION DOSE REDUCTION: This exam was performed according to the departmental dose-optimization program which includes automated exposure control, adjustment of the mA and/or kV according to patient size and/or use of iterative reconstruction technique. COMPARISON:  None Available. FINDINGS: CT HEAD FINDINGS Brain: Cerebral ventricle sizes are concordant with the degree of cerebral volume loss. Patchy and confluent areas of decreased attenuation are noted throughout the deep and periventricular white matter of the cerebral hemispheres bilaterally, compatible with chronic microvascular ischemic disease. No evidence of large-territorial acute infarction. No parenchymal hemorrhage. No mass lesion. No extra-axial collection. No mass effect or midline shift. No hydrocephalus. Basilar cisterns are patent. Vascular: No hyperdense vessel. Atherosclerotic calcifications are present within the cavernous internal carotid and vertebral arteries. Skull: No acute fracture or  focal lesion. Sinuses/Orbits: Left frontal, bilateral ethmoid, bilateral maxillary sinus mucosal thickening. Otherwise paranasal sinuses and mastoid air cells are clear. Right lens replacement. Otherwise the orbits are unremarkable. Other: None. CT CERVICAL SPINE FINDINGS Alignment: Normal. Skull base and vertebrae: Multilevel moderate degenerative changes of the spine. No acute fracture. No aggressive appearing focal osseous lesion or focal pathologic process. Soft tissues and spinal canal: No prevertebral fluid or swelling. No visible canal hematoma. Upper chest: Biapical patchy airspace opacities. Emphysematous changes. Other: Atherosclerotic plaque of the aortic arch and its branches. IMPRESSION: 1. No acute intracranial abnormality. 2. No acute displaced fracture or traumatic listhesis of the cervical spine. 3. Sinus disease. 4. Biapical patchy airspace opacities. Please see separately dictated CT chest 03/11/2023. 5. Aortic Atherosclerosis (ICD10-I70.0) and Emphysema (ICD10-J43.9). Electronically Signed   By: Tish Frederickson M.D.   On: 03/11/2023 23:02   CT CERVICAL SPINE WO CONTRAST Result Date: 03/11/2023 CLINICAL DATA:  Head trauma, moderate-severe; Polytrauma, blunt. Fall EXAM: CT HEAD WITHOUT CONTRAST CT CERVICAL SPINE WITHOUT CONTRAST TECHNIQUE: Multidetector CT imaging of the head and cervical spine was performed following the standard protocol without intravenous contrast. Multiplanar CT image reconstructions of the cervical spine were also generated. RADIATION DOSE REDUCTION: This exam was performed according to the departmental dose-optimization program which includes automated exposure control, adjustment of the mA and/or kV according to patient size and/or use of iterative reconstruction technique. COMPARISON:  None Available. FINDINGS: CT HEAD FINDINGS Brain: Cerebral ventricle sizes are concordant with the degree of cerebral volume loss. Patchy and confluent areas of decreased attenuation are  noted throughout the deep and periventricular white matter of the cerebral hemispheres bilaterally, compatible with chronic microvascular ischemic disease. No evidence of large-territorial acute infarction. No parenchymal hemorrhage. No mass lesion. No extra-axial collection. No mass effect or midline shift. No hydrocephalus. Basilar cisterns are patent. Vascular: No hyperdense vessel. Atherosclerotic calcifications are present within the cavernous internal carotid and vertebral arteries. Skull: No acute fracture or focal lesion. Sinuses/Orbits: Left frontal, bilateral ethmoid, bilateral maxillary sinus mucosal thickening. Otherwise paranasal sinuses and mastoid air cells are clear. Right lens replacement. Otherwise the orbits are unremarkable. Other: None. CT CERVICAL SPINE FINDINGS Alignment: Normal. Skull base and vertebrae: Multilevel moderate degenerative changes of the spine. No acute fracture. No aggressive appearing focal osseous lesion or focal pathologic process. Soft tissues and spinal canal: No prevertebral fluid or swelling. No visible canal hematoma. Upper chest: Biapical patchy airspace opacities. Emphysematous changes. Other: Atherosclerotic plaque of the aortic arch and its branches. IMPRESSION: 1. No acute intracranial abnormality. 2. No acute displaced fracture  or traumatic listhesis of the cervical spine. 3. Sinus disease. 4. Biapical patchy airspace opacities. Please see separately dictated CT chest 03/11/2023. 5. Aortic Atherosclerosis (ICD10-I70.0) and Emphysema (ICD10-J43.9). Electronically Signed   By: Tish Frederickson M.D.   On: 03/11/2023 23:02   DG Chest Port 1 View Result Date: 03/11/2023 CLINICAL DATA:  Trauma. EXAM: PORTABLE CHEST 1 VIEW COMPARISON:  Radiograph 02/16/2023 FINDINGS: Lung volumes are low. Stable heart size and mediastinal contours. There are diffuse interstitial opacities are new from prior exam, suspicious for pulmonary edema. Possible left pleural effusion. No  convincing pneumothorax skull multiple moderate overlying artifacts. On limited assessment, no displaced rib fracture. IMPRESSION: Low lung volumes. Diffuse interstitial opacities suspicious for pulmonary edema. Possible small left pleural effusion. Electronically Signed   By: Narda Rutherford M.D.   On: 03/11/2023 22:14   DG Pelvis Portable Result Date: 03/11/2023 CLINICAL DATA:  Blunt trauma, pain. EXAM: RIGHT FEMUR PORTABLE 1 VIEW; PORTABLE PELVIS 1-2 VIEWS COMPARISON:  None Available. FINDINGS: Pelvis: Displaced right femoral neck fracture. Proximal migration of the femoral shaft. No additional pelvic fracture. Pubic rami are intact. Pubic symphysis and sacroiliac joints are congruent. Femur: Frontal views of the right femur obtained. Displaced femoral neck fracture with proximal migration of the femoral shaft. The distal femur is intact. Distal most femur including the knee not included in the field of view. IMPRESSION: Displaced right femoral neck fracture. Electronically Signed   By: Narda Rutherford M.D.   On: 03/11/2023 22:13   DG FEMUR PORT, 1V RIGHT Result Date: 03/11/2023 CLINICAL DATA:  Blunt trauma, pain. EXAM: RIGHT FEMUR PORTABLE 1 VIEW; PORTABLE PELVIS 1-2 VIEWS COMPARISON:  None Available. FINDINGS: Pelvis: Displaced right femoral neck fracture. Proximal migration of the femoral shaft. No additional pelvic fracture. Pubic rami are intact. Pubic symphysis and sacroiliac joints are congruent. Femur: Frontal views of the right femur obtained. Displaced femoral neck fracture with proximal migration of the femoral shaft. The distal femur is intact. Distal most femur including the knee not included in the field of view. IMPRESSION: Displaced right femoral neck fracture. Electronically Signed   By: Narda Rutherford M.D.   On: 03/11/2023 22:13     Assessment and Plan:   Preoperative evaluation for right femoral neck fracture November 2024 he had cardiac catheterization that demonstrated  nonobstructive CAD given reduced EF 30 to 35% with regional wall motion abnormalities and 14% PVC burden.  Overall appears to be functional, asymptomatic, and able to complete greater than 4 METS.  RCRI indicates 15% risk of MACE.  Given his age and concomitant multifocal pneumonia I would say he is at least moderate to high risk for upcoming procedure however further workup is not necessary given already sent extensive workup.  Furthermore he is euvolemic and just needs to be optimized from a respiratory standpoint for his pneumonia.  Be mindful of borderline hypotension and possible sepsis with concomitant pneumonia.  Nonobstructive CAD Diffuse 30 to 40% stenosis, no interventions. Already on aspirin and Plavix, carvedilol 6.25 mg twice daily, rosuvastatin 10 mg  Nonischemic cardiomyopathy with reduced EF Echocardiogram in September 2024 showed EF 30 to 35% with normal RV function.  Euvolemic, asymptomatic.  GDMT limited by renal disease. Can continue his carvedilol 6.25 mg twice daily.  Can titrate GDMT further after procedure if stable, or with primary cardiologist.  Frequent PVCs/NSVT 14% burden on past ZIO monitor Continue amiodarone 400 mg daily   Risk Assessment/Risk Scores:    New York Heart Association (NYHA) Functional Class NYHA Class I  For questions or updates, please contact [facility] Please consult [email or web address] for contact info under  Signed, Abagail Kitchens, PA-C  03/12/2023 7:35 AM  As above, patient seen and examined.  Briefly he is an 89 year old male with past medical history of moderate coronary disease, nonischemic cardiomyopathy, prior TIA, hypertension, chronic kidney disease, COPD for preoperative evaluation prior to repair of hip fracture.  Patient has had an extensive recent evaluation at [facility].  Echocardiogram September 2024 showed ejection fraction 30 to 35%, akinesis of the basal inferior and inferoseptal walls, grade 1 diastolic dysfunction, mild  left atrial enlargement.  Cardiac catheterization November 2024 showed moderate nonobstructive coronary disease with 30% left main, 40% LAD, 40% ramus and mild ostial stenosis in the right coronary artery.  Patient is able to walk 20 to 25 minutes with no chest pain.  He has dyspnea with more vigorous activities but not routine activities.  No orthopnea, PND or history of syncope.  Recently fell on the ice causing hip fracture.  Cardiology asked to evaluate preoperatively.  Creatinine 2.0, potassium 3.9, troponin 15 and 26, BNP 185.  Electrocardiogram shows normal sinus rhythm, PVCs, nonspecific ST changes.  1 preoperative evaluation prior to repair of hip fracture-patient has good functional capacity with no chest pain.  Recent cardiac catheterization revealed moderate nonobstructive coronary disease.  He is not in congestive heart failure.  He may proceed without further cardiac testing.  2 nonischemic cardiomyopathy-felt possibly secondary to ventricular ectopy.  Continue amiodarone and carvedilol.  Would resume preadmission dose of Avapro and spironolactone following surgery once blood pressure stable.  Would consider transitioning avapro to [facility] as an outpatient as well as addition of SGLT2 inhibitor.  I will leave this to his primary cardiologist.  3 ventricular ectopy-as outlined above continue amiodarone and carvedilol.  4 history of TIA-resume aspirin and Plavix following surgery.  5 nonobstructive coronary artery disease-no chest pain.  Continue medical therapy with aspirin and statin.  6 status post hip fracture-per orthopedics.  Cardiology will be available as needed.  Please call with questions.  Olga Millers, MD

## 2023-03-12 NOTE — H&P (Signed)
History and Physical    Patient: Jonathan Mathews WUJ:[id number] DOB: 07-Sep-1933 DOA: 03/11/2023 DOS: the patient was seen and examined on 03/12/2023 PCP: [facility], DO  Patient coming from: Home  Chief Complaint:  Chief Complaint  Patient presents with   Fall   HPI: Jonathan Mathews is a 89 y.o. male with medical history significant of PAF, HFrEF (EF 30-35%), CAD, HTN, TIA, on pradaxa.  Pt walking outside to check mail just PTA last evening, slipped and fell on patch of ice.  Pain to R hip, RLE deformity.  No head injury nor LOC.  Hypoxic on arrival though denies SOB symptoms.  In ED found to have: 1) R femoral neck fx 2) new O2 requirement from what appears to be multifocal PNA.  Review of Systems: As mentioned in the history of present illness. All other systems reviewed and are negative. Past Medical History:  Diagnosis Date   Atrial fibrillation (HCC)    BPH (benign prostatic hyperplasia)    Coronary artery disease    Environmental allergies    GERD (gastroesophageal reflux disease)    High cholesterol    Hypertension    Ventricular tachycardia (HCC)    Past Surgical History:  Procedure Laterality Date   APPENDECTOMY     TONSILLECTOMY     Social History:  reports that he has quit smoking. He has never used smokeless tobacco. He reports that he does not drink alcohol and does not use drugs.  Allergies  Allergen Reactions   Clindamycin/Lincomycin Diarrhea   Doxycycline Nausea And Vomiting    Gi upset    Pravastatin     Urinary retention   Zocor [Simvastatin]     myalgia   Ivp Dye [Iodinated Contrast Media] Rash    History reviewed. No pertinent family history.  Prior to Admission medications   Medication Sig Start Date End Date Taking? Authorizing Provider  albuterol (PROVENTIL) (2.5 MG/3ML) 0.083% nebulizer solution Inhale 2.5 mg into the lungs in the morning and at bedtime. 03/11/18  Yes [provider]  albuterol (VENTOLIN HFA) 108 (90 Base)  MCG/ACT inhaler Inhale 1-2 puffs into the lungs every 6 (six) hours as needed for wheezing or shortness of breath. 02/10/17  Yes [provider]  ALPRAZolam Prudy Feeler) 0.5 MG tablet Take 0.5 mg by mouth daily as needed for anxiety.   Yes [provider]  amiodarone (PACERONE) 400 MG tablet Take by mouth. Take 1 tablet (400 mg total) by mouth daily. (May take 2 x 200mg  tablets daily prior to refill) 01/14/23  Yes [provider]  aspirin 81 MG chewable tablet Chew 81 mg by mouth daily. 01/02/23  Yes [provider]  azelastine (ASTELIN) 137 MCG/SPRAY nasal spray Place 1 spray into the nose 2 (two) times daily. Use in each nostril as directed   Yes [provider]  carvedilol (COREG) 6.25 MG tablet Take 6.25 mg by mouth 2 (two) times daily with a meal. 02/25/23 02/25/24 Yes [provider]  cetirizine (ZYRTEC) 10 MG tablet Take 10 mg by mouth daily. 09/05/18  Yes [provider]  clopidogrel (PLAVIX) 75 MG tablet Take 75 mg by mouth daily.   Yes [provider]  fish oil-omega-3 fatty acids 1000 MG capsule Take 2 g by mouth daily.   Yes [provider]  fluticasone (FLONASE) 50 MCG/ACT nasal spray USE 2 SPRAYS NASALLY DAILY 02/19/18  Yes [provider]  Fluticasone-Umeclidin-Vilant (TRELEGY ELLIPTA) 100-62.5-25 MCG/ACT AEPB Inhale 1 puff into the lungs daily  at 6 (six) AM. 01/13/23  Yes [provider]  furosemide (LASIX) 20 MG tablet Take 20 mg by mouth daily.   Yes [provider]  irbesartan (AVAPRO) 150 MG tablet Take 150 mg by mouth in the morning and at bedtime. 07/20/18  Yes [provider]  ketoconazole (NIZORAL) 2 % cream Apply topically daily.   Yes [provider]  montelukast (SINGULAIR) 10 MG tablet Take 10 mg by mouth at bedtime. 07/14/18  Yes [provider]  omeprazole (PRILOSEC) 40 MG capsule Take 40 mg by mouth daily.   Yes [provider]  polyethylene  glycol powder (GLYCOLAX/MIRALAX) 17 GM/SCOOP powder Take 17 g by mouth daily as needed for mild constipation. 06/14/18  Yes [provider]  rosuvastatin (CRESTOR) 10 MG tablet Take 10 mg by mouth daily. 01/01/22  Yes [provider]  spironolactone (ALDACTONE) 25 MG tablet Take 25 mg by mouth daily. 02/19/23  Yes [provider]  tamsulosin (FLOMAX) 0.4 MG CAPS capsule Take 0.4 mg by mouth daily. 04/07/19  Yes [provider]    Physical Exam: Vitals:   03/12/23 0200 03/12/23 0415 03/12/23 0445 03/12/23 0530  BP: (!) 102/55 111/68 111/64 102/77  Pulse: 75 78 76 76  Resp: 16 19 16 20   Temp:    97.8 F (36.6 C)  TempSrc:    Oral  SpO2: 96% 97% 97% 98%  Weight:      Height:       Constitutional: NAD, calm, comfortable Respiratory: Normal respiratory effort. No accessory muscle use.  Cardiovascular: Regular rate and rhythm, no murmurs / rubs / gallops. No extremity edema. 2+ pedal pulses. No carotid bruits.  Abdomen: no tenderness, no masses palpated. No hepatosplenomegaly. Bowel sounds positive.  Musculoskeletal: R Hip TTP. Neurologic: CN 2-12 grossly intact. Sensation intact, DTR normal. Strength 5/5 in all 4.  Psychiatric: Normal judgment and insight. Alert and oriented x 3. Normal mood.    Data Reviewed:    Labs on Admission: I have personally reviewed following labs and imaging studies  CBC: Recent Labs  Lab 03/11/23 2148 03/11/23 2202  WBC 11.6*  --   HGB 14.1 15.0  HCT 43.2 44.0  MCV 99.1  --   PLT 269  --    Basic Metabolic Panel: Recent Labs  Lab 03/11/23 2148 03/11/23 2202  NA 137 141  K 3.6 3.9  CL 107 106  CO2 22  --   GLUCOSE 138* 135*  BUN 23 30*  CREATININE 1.88* 2.00*  CALCIUM 8.6*  --    GFR: Estimated Creatinine Clearance: 25.2 mL/min (A) (by C-G formula based on SCr of 2 mg/dL (H)). Liver Function Tests: Recent Labs  Lab 03/11/23 2148  AST 39  ALT 44  ALKPHOS 80  BILITOT 1.5*  PROT 5.8*  ALBUMIN 3.4*    No results for input(s): "LIPASE", "AMYLASE" in the last 168 hours. No results for input(s): "AMMONIA" in the last 168 hours. Coagulation Profile: Recent Labs  Lab 03/11/23 2148  INR 1.1   Cardiac Enzymes: No results for input(s): "CKTOTAL", "CKMB", "CKMBINDEX", "TROPONINI" in the last 168 hours. BNP (last 3 results) No results for input(s): "PROBNP" in the last 8760 hours. HbA1C: No results for input(s): "HGBA1C" in the last 72 hours. CBG: No results for input(s): "GLUCAP" in the last 168 hours. Lipid Profile: No results for input(s): "CHOL", "HDL", "LDLCALC", "TRIG", "CHOLHDL", "LDLDIRECT" in the last 72 hours. Thyroid Function Tests: No results for input(s): "TSH", "T4TOTAL", "FREET4", "T3FREE", "THYROIDAB" in  the last 72 hours. Anemia Panel: No results for input(s): "VITAMINB12", "FOLATE", "FERRITIN", "TIBC", "IRON", "RETICCTPCT" in the last 72 hours. Urine analysis:    Component Value Date/Time   COLORURINE YELLOW 03/12/2023 0345   APPEARANCEUR CLEAR 03/12/2023 0345   LABSPEC 1.016 03/12/2023 0345   PHURINE 5.0 03/12/2023 0345   GLUCOSEU NEGATIVE 03/12/2023 0345   HGBUR NEGATIVE 03/12/2023 0345   BILIRUBINUR NEGATIVE 03/12/2023 0345   KETONESUR 5 (A) 03/12/2023 0345   PROTEINUR NEGATIVE 03/12/2023 0345   NITRITE NEGATIVE 03/12/2023 0345   LEUKOCYTESUR NEGATIVE 03/12/2023 0345    Radiological Exams on Admission: CT CHEST ABDOMEN PELVIS WO CONTRAST Result Date: 03/11/2023 CLINICAL DATA:  Polytrauma, blunt EXAM: CT CHEST, ABDOMEN AND PELVIS WITHOUT CONTRAST TECHNIQUE: Multidetector CT imaging of the chest, abdomen and pelvis was performed following the standard protocol without IV contrast. RADIATION DOSE REDUCTION: This exam was performed according to the departmental dose-optimization program which includes automated exposure control, adjustment of the mA and/or kV according to patient size and/or use of iterative reconstruction technique. COMPARISON:  None Available.  FINDINGS: CHEST: Cardiovascular: The thoracic aorta is normal in caliber. The heart is normal in size. No significant pericardial effusion. Four-vessel coronary calcification. Lungs/Pleura: Centrilobular and paraseptal mild-to-moderate emphysematous changes. Diffuse bronchial wall thickening. Dependent bilateral upper and lower lung zones patchy airspace opacities. No pulmonary nodule. No pulmonary mass. No pulmonary contusion or laceration. No pneumatocele formation. No pleural effusion. No pneumothorax. No hemothorax. Mediastinum/Nodes: No pneumomediastinum. The central airways are patent. The esophagus is unremarkable.  Small hiatal hernia. The thyroid is unremarkable. Limited evaluation for hilar lymphadenopathy on this noncontrast study. No mediastinal or axillary lymphadenopathy. Musculoskeletal/Chest wall No chest wall mass.  Bilateral gynecomastia. No acute rib or sternal fracture. No spinal fracture. ABDOMEN / PELVIS: Hepatobiliary: Not enlarged. No focal lesion. The gallbladder is otherwise unremarkable with no radio-opaque gallstones. No biliary ductal dilatation. Pancreas: Normal pancreatic contour. No main pancreatic duct dilatation. Spleen: Not enlarged. No focal lesion. Adrenals/Urinary Tract: No nodularity bilaterally. No hydroureteronephrosis. No nephroureterolithiasis. No contour deforming renal mass. The urinary bladder is unremarkable. Stomach/Bowel: No small or large bowel wall thickening or dilatation. Colonic diverticulosis. The appendix is unremarkable. Vasculature/Lymphatic: Severe atherosclerotic plaque. No abdominal aorta or iliac aneurysm. No abdominal, pelvic, inguinal lymphadenopathy. Reproductive: Normal. Other: No simple free fluid ascites. No pneumoperitoneum. No mesenteric hematoma identified. No organized fluid collection. Musculoskeletal: No significant soft tissue hematoma. Acute displaced, comminuted, impacted right femoral neck fracture. No spinal fracture. Multilevel  degenerative changes and intervertebral disc space vacuum phenomenon. Ports and Devices: None. IMPRESSION: 1. No acute intrathoracic, intra-abdominal, intrapelvic traumatic injury. 2. No acute fracture or traumatic malalignment of the thoracic or lumbar spine. 3. Acute displaced, comminuted, impacted right femoral neck fracture. 4. Diffuse bronchial wall thickening. Dependent bilateral upper and lower lung zones patchy airspace opacities. Findings could represent developing infection/inflammation (aspiration pneumonia). Recommend follow up CT in 3 months to evaluate for complete resolution. 5. Other imaging findings of potential clinical significance: Small hiatal hernia. Colonic diverticulosis with no acute diverticulitis. Aortic Atherosclerosis (ICD10-I70.0) including four-vessel coronary calcifications. 6. Emphysema (ICD10-J43.9) . Electronically Signed   By: Tish Frederickson M.D.   On: 03/11/2023 23:40   CT HIP RIGHT WO CONTRAST Result Date: 03/11/2023 CLINICAL DATA:  Hip trauma, fracture suspected, xray done EXAM: CT OF THE RIGHT HIP WITHOUT CONTRAST TECHNIQUE: Multidetector CT imaging of the right hip was performed according to the standard protocol. Multiplanar CT image reconstructions were also generated. RADIATION DOSE REDUCTION: This exam was performed according  to the departmental dose-optimization program which includes automated exposure control, adjustment of the mA and/or kV according to patient size and/or use of iterative reconstruction technique. COMPARISON:  CT abdomen pelvis 03/11/2023, x-ray right hip 03/11/2023 FINDINGS: Bones/Joint/Cartilage Acute displaced, comminuted, and impacted right femoral neck fracture. No dislocation. No acute fracture of the right pelvic bones. Ligaments Suboptimally assessed by CT. Muscles and Tendons Grossly unremarkable. Soft tissues Right hip subcutaneus soft tissue edema/hematoma formation. Other: Colonic diverticulosis.  Atherosclerotic plaque. IMPRESSION: 1.  Acute displaced, comminuted, and impacted right femoral neck fracture. 2.  Aortic Atherosclerosis (ICD10-I70.0). Electronically Signed   By: Tish Frederickson M.D.   On: 03/11/2023 23:04   CT HEAD WO CONTRAST Result Date: 03/11/2023 CLINICAL DATA:  Head trauma, moderate-severe; Polytrauma, blunt. Fall EXAM: CT HEAD WITHOUT CONTRAST CT CERVICAL SPINE WITHOUT CONTRAST TECHNIQUE: Multidetector CT imaging of the head and cervical spine was performed following the standard protocol without intravenous contrast. Multiplanar CT image reconstructions of the cervical spine were also generated. RADIATION DOSE REDUCTION: This exam was performed according to the departmental dose-optimization program which includes automated exposure control, adjustment of the mA and/or kV according to patient size and/or use of iterative reconstruction technique. COMPARISON:  None Available. FINDINGS: CT HEAD FINDINGS Brain: Cerebral ventricle sizes are concordant with the degree of cerebral volume loss. Patchy and confluent areas of decreased attenuation are noted throughout the deep and periventricular white matter of the cerebral hemispheres bilaterally, compatible with chronic microvascular ischemic disease. No evidence of large-territorial acute infarction. No parenchymal hemorrhage. No mass lesion. No extra-axial collection. No mass effect or midline shift. No hydrocephalus. Basilar cisterns are patent. Vascular: No hyperdense vessel. Atherosclerotic calcifications are present within the cavernous internal carotid and vertebral arteries. Skull: No acute fracture or focal lesion. Sinuses/Orbits: Left frontal, bilateral ethmoid, bilateral maxillary sinus mucosal thickening. Otherwise paranasal sinuses and mastoid air cells are clear. Right lens replacement. Otherwise the orbits are unremarkable. Other: None. CT CERVICAL SPINE FINDINGS Alignment: Normal. Skull base and vertebrae: Multilevel moderate degenerative changes of the spine. No  acute fracture. No aggressive appearing focal osseous lesion or focal pathologic process. Soft tissues and spinal canal: No prevertebral fluid or swelling. No visible canal hematoma. Upper chest: Biapical patchy airspace opacities. Emphysematous changes. Other: Atherosclerotic plaque of the aortic arch and its branches. IMPRESSION: 1. No acute intracranial abnormality. 2. No acute displaced fracture or traumatic listhesis of the cervical spine. 3. Sinus disease. 4. Biapical patchy airspace opacities. Please see separately dictated CT chest 03/11/2023. 5. Aortic Atherosclerosis (ICD10-I70.0) and Emphysema (ICD10-J43.9). Electronically Signed   By: Tish Frederickson M.D.   On: 03/11/2023 23:02   CT CERVICAL SPINE WO CONTRAST Result Date: 03/11/2023 CLINICAL DATA:  Head trauma, moderate-severe; Polytrauma, blunt. Fall EXAM: CT HEAD WITHOUT CONTRAST CT CERVICAL SPINE WITHOUT CONTRAST TECHNIQUE: Multidetector CT imaging of the head and cervical spine was performed following the standard protocol without intravenous contrast. Multiplanar CT image reconstructions of the cervical spine were also generated. RADIATION DOSE REDUCTION: This exam was performed according to the departmental dose-optimization program which includes automated exposure control, adjustment of the mA and/or kV according to patient size and/or use of iterative reconstruction technique. COMPARISON:  None Available. FINDINGS: CT HEAD FINDINGS Brain: Cerebral ventricle sizes are concordant with the degree of cerebral volume loss. Patchy and confluent areas of decreased attenuation are noted throughout the deep and periventricular white matter of the cerebral hemispheres bilaterally, compatible with chronic microvascular ischemic disease. No evidence of large-territorial acute infarction. No parenchymal hemorrhage. No  mass lesion. No extra-axial collection. No mass effect or midline shift. No hydrocephalus. Basilar cisterns are patent. Vascular: No  hyperdense vessel. Atherosclerotic calcifications are present within the cavernous internal carotid and vertebral arteries. Skull: No acute fracture or focal lesion. Sinuses/Orbits: Left frontal, bilateral ethmoid, bilateral maxillary sinus mucosal thickening. Otherwise paranasal sinuses and mastoid air cells are clear. Right lens replacement. Otherwise the orbits are unremarkable. Other: None. CT CERVICAL SPINE FINDINGS Alignment: Normal. Skull base and vertebrae: Multilevel moderate degenerative changes of the spine. No acute fracture. No aggressive appearing focal osseous lesion or focal pathologic process. Soft tissues and spinal canal: No prevertebral fluid or swelling. No visible canal hematoma. Upper chest: Biapical patchy airspace opacities. Emphysematous changes. Other: Atherosclerotic plaque of the aortic arch and its branches. IMPRESSION: 1. No acute intracranial abnormality. 2. No acute displaced fracture or traumatic listhesis of the cervical spine. 3. Sinus disease. 4. Biapical patchy airspace opacities. Please see separately dictated CT chest 03/11/2023. 5. Aortic Atherosclerosis (ICD10-I70.0) and Emphysema (ICD10-J43.9). Electronically Signed   By: Tish Frederickson M.D.   On: 03/11/2023 23:02   DG Chest Port 1 View Result Date: 03/11/2023 CLINICAL DATA:  Trauma. EXAM: PORTABLE CHEST 1 VIEW COMPARISON:  Radiograph 02/16/2023 FINDINGS: Lung volumes are low. Stable heart size and mediastinal contours. There are diffuse interstitial opacities are new from prior exam, suspicious for pulmonary edema. Possible left pleural effusion. No convincing pneumothorax skull multiple moderate overlying artifacts. On limited assessment, no displaced rib fracture. IMPRESSION: Low lung volumes. Diffuse interstitial opacities suspicious for pulmonary edema. Possible small left pleural effusion. Electronically Signed   By: Narda Rutherford M.D.   On: 03/11/2023 22:14   DG Pelvis Portable Result Date:  03/11/2023 CLINICAL DATA:  Blunt trauma, pain. EXAM: RIGHT FEMUR PORTABLE 1 VIEW; PORTABLE PELVIS 1-2 VIEWS COMPARISON:  None Available. FINDINGS: Pelvis: Displaced right femoral neck fracture. Proximal migration of the femoral shaft. No additional pelvic fracture. Pubic rami are intact. Pubic symphysis and sacroiliac joints are congruent. Femur: Frontal views of the right femur obtained. Displaced femoral neck fracture with proximal migration of the femoral shaft. The distal femur is intact. Distal most femur including the knee not included in the field of view. IMPRESSION: Displaced right femoral neck fracture. Electronically Signed   By: Narda Rutherford M.D.   On: 03/11/2023 22:13   DG FEMUR PORT, 1V RIGHT Result Date: 03/11/2023 CLINICAL DATA:  Blunt trauma, pain. EXAM: RIGHT FEMUR PORTABLE 1 VIEW; PORTABLE PELVIS 1-2 VIEWS COMPARISON:  None Available. FINDINGS: Pelvis: Displaced right femoral neck fracture. Proximal migration of the femoral shaft. No additional pelvic fracture. Pubic rami are intact. Pubic symphysis and sacroiliac joints are congruent. Femur: Frontal views of the right femur obtained. Displaced femoral neck fracture with proximal migration of the femoral shaft. The distal femur is intact. Distal most femur including the knee not included in the field of view. IMPRESSION: Displaced right femoral neck fracture. Electronically Signed   By: Narda Rutherford M.D.   On: 03/11/2023 22:13    EKG: Independently reviewed.   Assessment and Plan: * Closed displaced fracture of right femoral neck (HCC) Hip fx pathway NPO Dr. Linna Caprice to see in AM Due to HFrEF history also having cards weigh in on OR risk. Holding pradaxa SCDs and lovenox for DVT ppx (lovenox ordered for 1600 today, DC before hand if ends up going to OR today). Pain control including IV opiates per pathway.  Acute respiratory failure with hypoxia (HCC) Pt with new O2 requirement, apparent multifocal PNA on  CT chest  today.   AKI (acute kidney injury) (HCC) Pt with apparent AKI on CKD 3: creat of 1.88 today up from baseline 1.5 it looks like. Holding diuretics for the moment. Repeat BMP in AM Strict intake and output.  Community acquired pneumonia DDx = decompensated CHF, though CT scan read sounds more like PNA. PNA pathway Empiric rocephin / azithro Check Covid + RVP  Syst congestive heart failure with reduced LV function, NYHA class 1 (HCC) Hold diuretics due to AKI Cont coreg Hold ARB due to AKI Message sent to P. Trent for cards eval for pre-op in AM due to HFrEF history.  Paroxysmal atrial fibrillation (HCC) Cont amiodarone and coreg Holding pradaxa due to need for surgery  Moderate COPD (chronic obstructive pulmonary disease) (HCC) Cont home nebs Doesn't seem to be in exacerbation, pt asymptomatic despite O2 requirement and CT chest findings.      Advance Care Planning:   Code Status: Full Code  Consults: Dr. Linna Caprice Message sent to P. Johna Sheriff as noted above for cards eval  Family Communication: No family in room  Severity of Illness: The appropriate patient status for this patient is INPATIENT. Inpatient status is judged to be reasonable and necessary in order to provide the required intensity of service to ensure the patient's safety. The patient's presenting symptoms, physical exam findings, and initial radiographic and laboratory data in the context of their chronic comorbidities is felt to place them at high risk for further clinical deterioration. Furthermore, it is not anticipated that the patient will be medically stable for discharge from the hospital within 2 midnights of admission.   * I certify that at the point of admission it is my clinical judgment that the patient will require inpatient hospital care spanning beyond 2 midnights from the point of admission due to high intensity of service, high risk for further deterioration and high frequency of surveillance  required.*  Author: Hillary Bow., DO 03/12/2023 6:04 AM  For on call review [email or web address].

## 2023-03-12 NOTE — Assessment & Plan Note (Addendum)
DDx = decompensated CHF, though CT scan read sounds more like PNA. PNA pathway Empiric rocephin / azithro Check Covid + RVP

## 2023-03-12 NOTE — Anesthesia Procedure Notes (Signed)
Anesthesia Regional Block: Femoral nerve block   Pre-Anesthetic Checklist: , timeout performed,  Correct Patient, Correct Site, Correct Laterality,  Correct Procedure, Correct Position, site marked,  Risks and benefits discussed,  Surgical consent,  Pre-op evaluation,  At surgeon's request and post-op pain management  Laterality: Lower and Right  Prep: chloraprep       Needles:  Injection technique: Single-shot  Needle Type: Stimulator Needle - 80     Needle Length: 9cm  Needle Gauge: 22   Needle insertion depth: 6 cm   Additional Needles:   Procedures:, nerve stimulator,,, ultrasound used (permanent image in chart),,     Nerve Stimulator or Paresthesia:  Response: Patellar snap, 0.5 mA  Additional Responses:   Narrative:  Start time: 03/12/2023 12:28 PM End time: 03/12/2023 12:48 PM Injection made incrementally with aspirations every 5 mL.  Performed by: Personally  Anesthesiologist: [name], MD  Additional Notes: BP cuff, EKG monitors applied. Sedation begun. Femoral artery palpated for location of nerve. After nerve location verified with U/S, anesthetic injected incrementally, slowly, and after negative aspirations under direct u/s guidance. Good perineural spread. Patient tolerated well.

## 2023-03-12 NOTE — Assessment & Plan Note (Addendum)
Hold diuretics due to AKI Cont coreg Hold ARB due to AKI Message sent to P. Trent for cards eval for pre-op in AM due to HFrEF history.

## 2023-03-12 NOTE — Assessment & Plan Note (Signed)
Pt with apparent AKI on CKD 3: creat of 1.88 today up from baseline 1.5 it looks like. Holding diuretics for the moment. Repeat BMP in AM Strict intake and output.

## 2023-03-12 NOTE — Progress Notes (Signed)
Jonathan Mathews is a 89 y.o. male with medical history significant of PAF, HFrEF (EF 30-35%), CAD, HTN, TIA, on pradaxa.  Patient presented with fall and right hip pain and was admitted with closed displaced fracture of the right femoral neck and is also noted to have community-acquired pneumonia along with AKI on CKD 3.  Patient seen and evaluated at bedside and has been admitted after midnight.  Orthopedics will evaluate and Pradaxa will be held.  Cardiology also asked to see patient due to history of HFrEF.  Diuretics being held on account of AKI.  Community acquired pneumonia being treated with empiric Rocephin and azithromycin with respiratory viral panel pending.  Orthopedics planning for surgery in the near future.  Follow-up a.m. labs.  Total care time: 35 minutes.

## 2023-03-12 NOTE — Progress Notes (Signed)
RT at bedside to find pt on HHFNC 60L 100% with SVS and in no distress. RT titrated pts FiO2 to 21% and titrated flow from 60L to 20L. Pt tolerated well but did have decrease in his SpO2 to 84% on room air. RT placed pt on salter (high flow) 5L. Pt tolerating well at this time.

## 2023-03-12 NOTE — Anesthesia Preprocedure Evaluation (Signed)
Anesthesia Evaluation  Patient identified by MRN, date of birth, ID band Patient awake    Reviewed: Allergy & Precautions, Patient's Chart, lab work & pertinent test results, reviewed documented beta blocker date and time   Airway Mallampati: III  TM Distance: >3 FB Neck ROM: Full    Dental  (+) Partial Lower, Partial Upper, Lower Dentures, Upper Dentures   Pulmonary pneumonia, COPD, former smoker   Pulmonary exam normal breath sounds clear to auscultation       Cardiovascular hypertension, Pt. on medications and Pt. on home beta blockers + CAD and +CHF  Normal cardiovascular exam Rhythm:Regular Rate:Normal  Echo 11/06/2022 There is no significant change in comparison with the last study. There is normal left ventricular size and wall thickness. There is akinesis of the 'basal inferior and inferoseptal' left ventricular segments, consistent with prior infarct, and hypokinesis of the remaining left ventricular segments, consistent with left ventricular [ischemic] remodelling. Left ventricular systolic function is moderate to severely reduced. LV ejection fraction = 30-35%. There is mild, Grade I diastolic dysfunction, with indeterminate left atrial pressure. Left atrial pressure is indeterminate due to 'E/e' > 8 and < 14' The right ventricle is normal in size and function. The left atrium is mildly dilated. There is no significant valvular stenosis or regurgitation. There was insufficient TR detected to calculate RV systolic pressure. IVC size was normal. The aortic sinus is normal size.  .   Nuclear stress test 09/16/2020 Fixed inferior wall defect with associated wall motion abnormality compatible with prior myocardial infarction.  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  The distal vessel is very tortuous with only minimal irregularities. RAMUS: large vessel with 40% proximal stenosis (technically the first diagonal) LCX: tortuous vessel that supplies a single small OM; free of significant disease. RCA: Very tortuous dominant vessel with mild ostial stenosis and scattered mild irregularities LVEDP is 19 mmHg; no significant LV-Ao gradient       Neuro/Psych negative neurological ROS     GI/Hepatic Neg liver ROS,GERD  ,,  Endo/Other  negative endocrine ROS    Renal/GU Renal InsufficiencyRenal disease     Musculoskeletal negative musculoskeletal ROS (+)    Abdominal   Peds  Hematology negative hematology ROS (+)   Anesthesia Other Findings   Reproductive/Obstetrics                             Anesthesia Physical Anesthesia Plan  ASA: 4  Anesthesia Plan: Regional   Post-op Pain Management:    Induction:   PONV Risk Score and Plan:   Airway Management Planned:   Additional Equipment:   Intra-op Plan:   Post-operative Plan:   Informed Consent: I have reviewed the patients History and Physical, chart, labs and discussed the procedure including the risks, benefits and alternatives for the proposed anesthesia with the patient or authorized representative who has indicated his/her understanding and acceptance.       Plan Discussed with:   Anesthesia Plan Comments:        Anesthesia Quick Evaluation

## 2023-03-12 NOTE — Assessment & Plan Note (Signed)
Hip fx pathway NPO Dr. Linna Caprice to see in AM Due to HFrEF history also having cards weigh in on OR risk. Holding pradaxa SCDs and lovenox for DVT ppx (lovenox ordered for 1600 today, DC before hand if ends up going to OR today). Pain control including IV opiates per pathway.

## 2023-03-13 DIAGNOSIS — S72001A Fracture of unspecified part of neck of right femur, initial encounter for closed fracture: Secondary | ICD-10-CM | POA: Diagnosis not present

## 2023-03-13 LAB — PROCALCITONIN: Procalcitonin: 0.11 ng/mL

## 2023-03-13 LAB — CBC
HCT: 39.9 % (ref 39.0–52.0)
Hemoglobin: 12.6 g/dL — ABNORMAL LOW (ref 13.0–17.0)
MCH: 32.1 pg (ref 26.0–34.0)
MCHC: 31.6 g/dL (ref 30.0–36.0)
MCV: 101.5 fL — ABNORMAL HIGH (ref 80.0–100.0)
Platelets: 238 10*3/uL (ref 150–400)
RBC: 3.93 MIL/uL — ABNORMAL LOW (ref 4.22–5.81)
RDW: 16.3 % — ABNORMAL HIGH (ref 11.5–15.5)
WBC: 14.2 10*3/uL — ABNORMAL HIGH (ref 4.0–10.5)
nRBC: 0 % (ref 0.0–0.2)

## 2023-03-13 LAB — BASIC METABOLIC PANEL
Anion gap: 9 (ref 5–15)
BUN: 29 mg/dL — ABNORMAL HIGH (ref 8–23)
CO2: 20 mmol/L — ABNORMAL LOW (ref 22–32)
Calcium: 8.4 mg/dL — ABNORMAL LOW (ref 8.9–10.3)
Chloride: 106 mmol/L (ref 98–111)
Creatinine, Ser: 1.21 mg/dL (ref 0.61–1.24)
GFR, Estimated: 58 mL/min — ABNORMAL LOW (ref >60)
Glucose, Bld: 146 mg/dL — ABNORMAL HIGH (ref 70–99)
Potassium: 3.9 mmol/L (ref 3.5–5.1)
Sodium: 135 mmol/L (ref 135–145)

## 2023-03-13 LAB — SURGICAL PCR SCREEN
MRSA, PCR: NEGATIVE
Staphylococcus aureus: NEGATIVE

## 2023-03-13 LAB — MAGNESIUM: Magnesium: 2.4 mg/dL (ref 1.7–2.4)

## 2023-03-13 LAB — BRAIN NATRIURETIC PEPTIDE: B Natriuretic Peptide: 270.3 pg/mL — ABNORMAL HIGH (ref 0.0–100.0)

## 2023-03-13 MED ORDER — ONDANSETRON HCL 4 MG/2ML IJ SOLN: 4.0000 mg | Freq: Four times a day (QID) | INTRAMUSCULAR | Status: DC | PRN

## 2023-03-13 MED ORDER — HYDRALAZINE HCL 20 MG/ML IJ SOLN: 10.0000 mg | INTRAMUSCULAR | Status: DC | PRN

## 2023-03-13 MED ORDER — SENNOSIDES-DOCUSATE SODIUM 8.6-50 MG PO TABS: 1.0000 | ORAL_TABLET | Freq: Every evening | ORAL | Status: DC | PRN

## 2023-03-13 MED ORDER — IPRATROPIUM-ALBUTEROL 0.5-2.5 (3) MG/3ML IN SOLN
3.0000 mL | RESPIRATORY_TRACT | Status: DC | PRN
  Administered 2023-03-17 – 2023-03-20 (×2): 3 mL via RESPIRATORY_TRACT
  Filled 2023-03-13: qty 3

## 2023-03-13 MED ORDER — METOPROLOL TARTRATE 5 MG/5ML IV SOLN: 5.0000 mg | INTRAVENOUS | Status: DC | PRN

## 2023-03-13 MED ORDER — GUAIFENESIN 100 MG/5ML PO LIQD: 5.0000 mL | ORAL | Status: DC | PRN

## 2023-03-13 MED ORDER — TRAZODONE HCL 50 MG PO TABS
50.0000 mg | ORAL_TABLET | Freq: Every evening | ORAL | Status: DC | PRN
  Administered 2023-03-13 – 2023-03-17 (×3): 50 mg via ORAL
  Filled 2023-03-13 (×3): qty 1

## 2023-03-13 NOTE — Plan of Care (Signed)
  Problem: Clinical Measurements: Goal: Ability to maintain clinical measurements within normal limits will improve Outcome: Progressing   Problem: Elimination: Goal: Will not experience complications related to urinary retention Outcome: Progressing   Problem: Safety: Goal: Ability to remain free from injury will improve Outcome: Progressing   Problem: Respiratory: Goal: Ability to maintain adequate ventilation will improve 03/13/2023 0517 by Virgilio Belling, RN Outcome: Progressing 03/13/2023 0516 by Virgilio Belling, RN Outcome: Progressing   Problem: Pain Management: Goal: Pain level will decrease Outcome: Progressing

## 2023-03-13 NOTE — Progress Notes (Signed)
PROGRESS NOTE    Jonathan Mathews  ZOX:[id number] DOB: 08-26-1933 DOA: 03/11/2023 PCP: [facility], DO    Brief Narrative:   89 y.o. male with medical history significant of PAF, HFrEF (EF 30-35%), CAD, HTN, TIA, on pradaxa.  Patient presented with fall and right hip pain and was admitted with closed displaced fracture of the right femoral neck and is also noted to have community-acquired pneumonia along with AKI on CKD 3.  P orthopedic requested patient to be transferred to [facility] long for surgery.  Cardiology team was consulted for clearance.   Assessment & Plan:  Principal Problem:   Closed displaced fracture of right femoral neck (HCC) Active Problems:   Acute respiratory failure with hypoxia (HCC)   Community acquired pneumonia   AKI (acute kidney injury) (HCC)   Chronic kidney disease, stage 3 (HCC)   Moderate COPD (chronic obstructive pulmonary disease) (HCC)   Panlobular emphysema (HCC)   Paroxysmal atrial fibrillation (HCC)   Syst congestive heart failure with reduced LV function, NYHA class 1 (HCC)   Right hip fracture, closed - Patient transferred from [facility] for surgical management per orthopedic recommendations.  Routine preop and postop management per orthopedic including pain control, wound care, DVT prophylaxis, therapy recommendations.  Acute respiratory distress with hypoxia Multifocal pneumonia - This was seen on the CT of the chest.  Currently on bronchodilators, empiric Rocephin and azithromycin.  Will check BNP and procalcitonin minimally elevated. -COVID, RSV, flu, respiratory viral panel-negative  Mild renal insufficiency on CKD stage IIIa - Baseline creatinine 1.5.  Creatinine peaked at 2.0  Congestive heart failure with reduced EF Nonischemic cardiomyopathy - Diuretics currently on hold due to AKI.  Cardiology team is following.  Recent cardiac cath showed nonobstructive CAD.  Currently chest pain-free.  Currently on aspirin, Plavix,  statin, Coreg  PVCs/frequent NSVT - On amiodarone.    COPD - Bronchodilators   DVT prophylaxis: enoxaparin (LOVENOX) injection 40 mg Start: 03/12/23 1600 SCDs Start: 03/12/23 0535    Code Status: Full Code Family Communication:  Selena Batten updated.  Status is: Inpatient Remains inpatient appropriate because: Continue hospital stay for hip management tomorrow    Subjective:  Seen at bedside, attempting to urinate.  Does not have any complaints at this time.  Examination:  General exam: Appears calm and comfortable  Respiratory system: Clear to auscultation. Respiratory effort normal. Cardiovascular system: S1 & S2 heard, RRR. No JVD, murmurs, rubs, gallops or clicks. No pedal edema. Gastrointestinal system: Abdomen is nondistended, soft and nontender. No organomegaly or masses felt. Normal bowel sounds heard. Central nervous system: Alert and oriented. No focal neurological deficits. Extremities: Symmetric 5 x 5 power. Skin: No rashes, lesions or ulcers Psychiatry: Judgement and insight appear normal. Mood & affect appropriate.                Diet Orders (From admission, onward)     Start     Ordered   03/14/23 0430  Diet NPO time specified  Diet effective ____        03/13/23 0808   03/12/23 1143  Diet Heart Room service appropriate? Yes; Fluid consistency: Thin  Diet effective now       Question Answer Comment  Room service appropriate? Yes   Fluid consistency: Thin      03/12/23 1142            Objective: Vitals:   03/12/23 2353 03/13/23 0636 03/13/23 0925 03/13/23 0937  BP: 111/69 124/72 120/65   Pulse:  66 69 70   Resp: 16 15 16    Temp: 98.3 F (36.8 C) 97.6 F (36.4 C) 98.5 F (36.9 C)   TempSrc:   Oral   SpO2: 96% 94% 97% 98%  Weight:      Height:        Intake/Output Summary (Last 24 hours) at 03/13/2023 1148 Last data filed at 03/13/2023 1000 Gross per 24 hour  Intake 874.06 ml  Output 500 ml  Net 374.06 ml   Filed Weights    03/11/23 2158  Weight: 77.1 kg    Scheduled Meds:  amiodarone  400 mg Oral Daily   aspirin  81 mg Oral Daily   azelastine  1 spray Each Nare BID   carvedilol  6.25 mg Oral BID WC   clopidogrel  75 mg Oral Daily   enoxaparin (LOVENOX) injection  40 mg Subcutaneous Q24H   fluticasone furoate-vilanterol  1 puff Inhalation Daily   And   umeclidinium bromide  1 puff Inhalation Daily   montelukast  10 mg Oral QHS   pantoprazole  80 mg Oral Daily   rosuvastatin  10 mg Oral Daily   tamsulosin  0.4 mg Oral Daily   Continuous Infusions:  azithromycin 500 mg (03/13/23 0026)   cefTRIAXone (ROCEPHIN)  IV 2 g (03/13/23 0946)    Nutritional status     Body mass index is 25.85 kg/m.  Data Reviewed:   CBC: Recent Labs  Lab 03/11/23 2148 03/11/23 2202 03/13/23 0346  WBC 11.6*  --  14.2*  HGB 14.1 15.0 12.6*  HCT 43.2 44.0 39.9  MCV 99.1  --  101.5*  PLT 269  --  238   Basic Metabolic Panel: Recent Labs  Lab 03/11/23 2148 03/11/23 2202 03/13/23 0346  NA 137 141 135  K 3.6 3.9 3.9  CL 107 106 106  CO2 22  --  20*  GLUCOSE 138* 135* 146*  BUN 23 30* 29*  CREATININE 1.88* 2.00* 1.21  CALCIUM 8.6*  --  8.4*  MG  --   --  2.4   GFR: Estimated Creatinine Clearance: 41.6 mL/min (by C-G formula based on SCr of 1.21 mg/dL). Liver Function Tests: Recent Labs  Lab 03/11/23 2148  AST 39  ALT 44  ALKPHOS 80  BILITOT 1.5*  PROT 5.8*  ALBUMIN 3.4*   No results for input(s): "LIPASE", "AMYLASE" in the last 168 hours. No results for input(s): "AMMONIA" in the last 168 hours. Coagulation Profile: Recent Labs  Lab 03/11/23 2148  INR 1.1   Cardiac Enzymes: No results for input(s): "CKTOTAL", "CKMB", "CKMBINDEX", "TROPONINI" in the last 168 hours. BNP (last 3 results) No results for input(s): "PROBNP" in the last 8760 hours. HbA1C: No results for input(s): "HGBA1C" in the last 72 hours. CBG: No results for input(s): "GLUCAP" in the last 168 hours. Lipid Profile: No  results for input(s): "CHOL", "HDL", "LDLCALC", "TRIG", "CHOLHDL", "LDLDIRECT" in the last 72 hours. Thyroid Function Tests: No results for input(s): "TSH", "T4TOTAL", "FREET4", "T3FREE", "THYROIDAB" in the last 72 hours. Anemia Panel: No results for input(s): "VITAMINB12", "FOLATE", "FERRITIN", "TIBC", "IRON", "RETICCTPCT" in the last 72 hours. Sepsis Labs: Recent Labs  Lab 03/11/23 2202 03/13/23 0322  PROCALCITON  --  0.11  LATICACIDVEN 1.1  --     Recent Results (from the past 240 hours)  Resp panel by RT-PCR (RSV, Flu A&B, Covid) Urine, Clean Catch     Status: None   Collection Time: 03/12/23  3:45 AM   Specimen: Urine, Clean  Catch; Nasal Swab  Result Value Ref Range Status   SARS Coronavirus 2 by RT PCR NEGATIVE NEGATIVE Final   Influenza A by PCR NEGATIVE NEGATIVE Final   Influenza B by PCR NEGATIVE NEGATIVE Final    Comment: (NOTE) The Xpert Xpress SARS-CoV-2/FLU/RSV plus assay is intended as an aid in the diagnosis of influenza from Nasopharyngeal swab specimens and should not be used as a sole basis for treatment. Nasal washings and aspirates are unacceptable for Xpert Xpress SARS-CoV-2/FLU/RSV testing.  Fact Sheet for Patients: BloggerCourse.com  Fact Sheet for Healthcare Providers: SeriousBroker.it  This test is not yet approved or cleared by the [place] FDA and has been authorized for detection and/or diagnosis of SARS-CoV-2 by FDA under an Emergency Use Authorization (EUA). This EUA will remain in effect (meaning this test can be used) for the duration of the COVID-19 declaration under Section 564(b)(1) of the Act, 21 U.S.C. section 360bbb-3(b)(1), unless the authorization is terminated or revoked.     Resp Syncytial Virus by PCR NEGATIVE NEGATIVE Final    Comment: (NOTE) Fact Sheet for Patients: BloggerCourse.com  Fact Sheet for Healthcare  Providers: SeriousBroker.it  This test is not yet approved or cleared by the [place] FDA and has been authorized for detection and/or diagnosis of SARS-CoV-2 by FDA under an Emergency Use Authorization (EUA). This EUA will remain in effect (meaning this test can be used) for the duration of the COVID-19 declaration under Section 564(b)(1) of the Act, 21 U.S.C. section 360bbb-3(b)(1), unless the authorization is terminated or revoked.  Performed at [facility], [address] [place], [place] [address]   Respiratory (~20 pathogens) panel by PCR     Status: None   Collection Time: 03/12/23  6:24 AM   Specimen: Nasopharyngeal Swab; Respiratory  Result Value Ref Range Status   Adenovirus NOT DETECTED NOT DETECTED Final   Coronavirus 229E NOT DETECTED NOT DETECTED Final    Comment: (NOTE) The Coronavirus on the Respiratory Panel, DOES NOT test for the novel  Coronavirus (2019 nCoV)    Coronavirus HKU1 NOT DETECTED NOT DETECTED Final   Coronavirus NL63 NOT DETECTED NOT DETECTED Final   Coronavirus OC43 NOT DETECTED NOT DETECTED Final   Metapneumovirus NOT DETECTED NOT DETECTED Final   Rhinovirus / Enterovirus NOT DETECTED NOT DETECTED Final   Influenza A NOT DETECTED NOT DETECTED Final   Influenza B NOT DETECTED NOT DETECTED Final   Parainfluenza Virus 1 NOT DETECTED NOT DETECTED Final   Parainfluenza Virus 2 NOT DETECTED NOT DETECTED Final   Parainfluenza Virus 3 NOT DETECTED NOT DETECTED Final   Parainfluenza Virus 4 NOT DETECTED NOT DETECTED Final   Respiratory Syncytial Virus NOT DETECTED NOT DETECTED Final   Bordetella pertussis NOT DETECTED NOT DETECTED Final   Bordetella Parapertussis NOT DETECTED NOT DETECTED Final   Chlamydophila pneumoniae NOT DETECTED NOT DETECTED Final   Mycoplasma pneumoniae NOT DETECTED NOT DETECTED Final    Comment: Performed at [facility], [address] [place], [place] [address]  SARS Coronavirus 2  by RT PCR (hospital order, performed in [facility] hospital lab) *cepheid single result test* Nasopharyngeal Swab     Status: None   Collection Time: 03/12/23  6:24 AM   Specimen: Nasopharyngeal Swab; Nasal Swab  Result Value Ref Range Status   SARS Coronavirus 2 by RT PCR NEGATIVE NEGATIVE Final    Comment: Performed at [facility], [address] [place], [place] [address]  Surgical PCR screen  Status: None   Collection Time: 03/13/23  3:22 AM   Specimen: Nasal Mucosa; Nasal Swab  Result Value Ref Range Status   MRSA, PCR NEGATIVE NEGATIVE Final   Staphylococcus aureus NEGATIVE NEGATIVE Final    Comment: (NOTE) The Xpert SA Assay (FDA approved for NASAL specimens in patients 62 years of age and older), is one component of a comprehensive surveillance program. It is not intended to diagnose infection nor to guide or monitor treatment. Performed at [facility], [address], [place], [place] [address]          Radiology Studies: CT CHEST ABDOMEN PELVIS WO CONTRAST Result Date: 03/11/2023 CLINICAL DATA:  Polytrauma, blunt EXAM: CT CHEST, ABDOMEN AND PELVIS WITHOUT CONTRAST TECHNIQUE: Multidetector CT imaging of the chest, abdomen and pelvis was performed following the standard protocol without IV contrast. RADIATION DOSE REDUCTION: This exam was performed according to the departmental dose-optimization program which includes automated exposure control, adjustment of the mA and/or kV according to patient size and/or use of iterative reconstruction technique. COMPARISON:  None Available. FINDINGS: CHEST: Cardiovascular: The thoracic aorta is normal in caliber. The heart is normal in size. No significant pericardial effusion. Four-vessel coronary calcification. Lungs/Pleura: Centrilobular and paraseptal mild-to-moderate emphysematous changes. Diffuse bronchial wall thickening. Dependent bilateral upper and lower lung zones patchy airspace opacities. No  pulmonary nodule. No pulmonary mass. No pulmonary contusion or laceration. No pneumatocele formation. No pleural effusion. No pneumothorax. No hemothorax. Mediastinum/Nodes: No pneumomediastinum. The central airways are patent. The esophagus is unremarkable.  Small hiatal hernia. The thyroid is unremarkable. Limited evaluation for hilar lymphadenopathy on this noncontrast study. No mediastinal or axillary lymphadenopathy. Musculoskeletal/Chest wall No chest wall mass.  Bilateral gynecomastia. No acute rib or sternal fracture. No spinal fracture. ABDOMEN / PELVIS: Hepatobiliary: Not enlarged. No focal lesion. The gallbladder is otherwise unremarkable with no radio-opaque gallstones. No biliary ductal dilatation. Pancreas: Normal pancreatic contour. No main pancreatic duct dilatation. Spleen: Not enlarged. No focal lesion. Adrenals/Urinary Tract: No nodularity bilaterally. No hydroureteronephrosis. No nephroureterolithiasis. No contour deforming renal mass. The urinary bladder is unremarkable. Stomach/Bowel: No small or large bowel wall thickening or dilatation. Colonic diverticulosis. The appendix is unremarkable. Vasculature/Lymphatic: Severe atherosclerotic plaque. No abdominal aorta or iliac aneurysm. No abdominal, pelvic, inguinal lymphadenopathy. Reproductive: Normal. Other: No simple free fluid ascites. No pneumoperitoneum. No mesenteric hematoma identified. No organized fluid collection. Musculoskeletal: No significant soft tissue hematoma. Acute displaced, comminuted, impacted right femoral neck fracture. No spinal fracture. Multilevel degenerative changes and intervertebral disc space vacuum phenomenon. Ports and Devices: None. IMPRESSION: 1. No acute intrathoracic, intra-abdominal, intrapelvic traumatic injury. 2. No acute fracture or traumatic malalignment of the thoracic or lumbar spine. 3. Acute displaced, comminuted, impacted right femoral neck fracture. 4. Diffuse bronchial wall thickening. Dependent  bilateral upper and lower lung zones patchy airspace opacities. Findings could represent developing infection/inflammation (aspiration pneumonia). Recommend follow up CT in 3 months to evaluate for complete resolution. 5. Other imaging findings of potential clinical significance: Small hiatal hernia. Colonic diverticulosis with no acute diverticulitis. Aortic Atherosclerosis (ICD10-I70.0) including four-vessel coronary calcifications. 6. Emphysema (ICD10-J43.9) . Electronically Signed   By: Tish Frederickson M.D.   On: 03/11/2023 23:40   CT HIP RIGHT WO CONTRAST Result Date: 03/11/2023 CLINICAL DATA:  Hip trauma, fracture suspected, xray done EXAM: CT OF THE RIGHT HIP WITHOUT CONTRAST TECHNIQUE: Multidetector CT imaging of the right hip was performed according to the standard protocol. Multiplanar CT image reconstructions were also generated. RADIATION DOSE REDUCTION: This exam was performed  according to the departmental dose-optimization program which includes automated exposure control, adjustment of the mA and/or kV according to patient size and/or use of iterative reconstruction technique. COMPARISON:  CT abdomen pelvis 03/11/2023, x-ray right hip 03/11/2023 FINDINGS: Bones/Joint/Cartilage Acute displaced, comminuted, and impacted right femoral neck fracture. No dislocation. No acute fracture of the right pelvic bones. Ligaments Suboptimally assessed by CT. Muscles and Tendons Grossly unremarkable. Soft tissues Right hip subcutaneus soft tissue edema/hematoma formation. Other: Colonic diverticulosis.  Atherosclerotic plaque. IMPRESSION: 1. Acute displaced, comminuted, and impacted right femoral neck fracture. 2.  Aortic Atherosclerosis (ICD10-I70.0). Electronically Signed   By: Tish Frederickson M.D.   On: 03/11/2023 23:04   CT HEAD WO CONTRAST Result Date: 03/11/2023 CLINICAL DATA:  Head trauma, moderate-severe; Polytrauma, blunt. Fall EXAM: CT HEAD WITHOUT CONTRAST CT CERVICAL SPINE WITHOUT CONTRAST  TECHNIQUE: Multidetector CT imaging of the head and cervical spine was performed following the standard protocol without intravenous contrast. Multiplanar CT image reconstructions of the cervical spine were also generated. RADIATION DOSE REDUCTION: This exam was performed according to the departmental dose-optimization program which includes automated exposure control, adjustment of the mA and/or kV according to patient size and/or use of iterative reconstruction technique. COMPARISON:  None Available. FINDINGS: CT HEAD FINDINGS Brain: Cerebral ventricle sizes are concordant with the degree of cerebral volume loss. Patchy and confluent areas of decreased attenuation are noted throughout the deep and periventricular white matter of the cerebral hemispheres bilaterally, compatible with chronic microvascular ischemic disease. No evidence of large-territorial acute infarction. No parenchymal hemorrhage. No mass lesion. No extra-axial collection. No mass effect or midline shift. No hydrocephalus. Basilar cisterns are patent. Vascular: No hyperdense vessel. Atherosclerotic calcifications are present within the cavernous internal carotid and vertebral arteries. Skull: No acute fracture or focal lesion. Sinuses/Orbits: Left frontal, bilateral ethmoid, bilateral maxillary sinus mucosal thickening. Otherwise paranasal sinuses and mastoid air cells are clear. Right lens replacement. Otherwise the orbits are unremarkable. Other: None. CT CERVICAL SPINE FINDINGS Alignment: Normal. Skull base and vertebrae: Multilevel moderate degenerative changes of the spine. No acute fracture. No aggressive appearing focal osseous lesion or focal pathologic process. Soft tissues and spinal canal: No prevertebral fluid or swelling. No visible canal hematoma. Upper chest: Biapical patchy airspace opacities. Emphysematous changes. Other: Atherosclerotic plaque of the aortic arch and its branches. IMPRESSION: 1. No acute intracranial abnormality.  2. No acute displaced fracture or traumatic listhesis of the cervical spine. 3. Sinus disease. 4. Biapical patchy airspace opacities. Please see separately dictated CT chest 03/11/2023. 5. Aortic Atherosclerosis (ICD10-I70.0) and Emphysema (ICD10-J43.9). Electronically Signed   By: Tish Frederickson M.D.   On: 03/11/2023 23:02   CT CERVICAL SPINE WO CONTRAST Result Date: 03/11/2023 CLINICAL DATA:  Head trauma, moderate-severe; Polytrauma, blunt. Fall EXAM: CT HEAD WITHOUT CONTRAST CT CERVICAL SPINE WITHOUT CONTRAST TECHNIQUE: Multidetector CT imaging of the head and cervical spine was performed following the standard protocol without intravenous contrast. Multiplanar CT image reconstructions of the cervical spine were also generated. RADIATION DOSE REDUCTION: This exam was performed according to the departmental dose-optimization program which includes automated exposure control, adjustment of the mA and/or kV according to patient size and/or use of iterative reconstruction technique. COMPARISON:  None Available. FINDINGS: CT HEAD FINDINGS Brain: Cerebral ventricle sizes are concordant with the degree of cerebral volume loss. Patchy and confluent areas of decreased attenuation are noted throughout the deep and periventricular white matter of the cerebral hemispheres bilaterally, compatible with chronic microvascular ischemic disease. No evidence of large-territorial acute infarction. No parenchymal hemorrhage.  No mass lesion. No extra-axial collection. No mass effect or midline shift. No hydrocephalus. Basilar cisterns are patent. Vascular: No hyperdense vessel. Atherosclerotic calcifications are present within the cavernous internal carotid and vertebral arteries. Skull: No acute fracture or focal lesion. Sinuses/Orbits: Left frontal, bilateral ethmoid, bilateral maxillary sinus mucosal thickening. Otherwise paranasal sinuses and mastoid air cells are clear. Right lens replacement. Otherwise the orbits are  unremarkable. Other: None. CT CERVICAL SPINE FINDINGS Alignment: Normal. Skull base and vertebrae: Multilevel moderate degenerative changes of the spine. No acute fracture. No aggressive appearing focal osseous lesion or focal pathologic process. Soft tissues and spinal canal: No prevertebral fluid or swelling. No visible canal hematoma. Upper chest: Biapical patchy airspace opacities. Emphysematous changes. Other: Atherosclerotic plaque of the aortic arch and its branches. IMPRESSION: 1. No acute intracranial abnormality. 2. No acute displaced fracture or traumatic listhesis of the cervical spine. 3. Sinus disease. 4. Biapical patchy airspace opacities. Please see separately dictated CT chest 03/11/2023. 5. Aortic Atherosclerosis (ICD10-I70.0) and Emphysema (ICD10-J43.9). Electronically Signed   By: Tish Frederickson M.D.   On: 03/11/2023 23:02   DG Chest Port 1 View Result Date: 03/11/2023 CLINICAL DATA:  Trauma. EXAM: PORTABLE CHEST 1 VIEW COMPARISON:  Radiograph 02/16/2023 FINDINGS: Lung volumes are low. Stable heart size and mediastinal contours. There are diffuse interstitial opacities are new from prior exam, suspicious for pulmonary edema. Possible left pleural effusion. No convincing pneumothorax skull multiple moderate overlying artifacts. On limited assessment, no displaced rib fracture. IMPRESSION: Low lung volumes. Diffuse interstitial opacities suspicious for pulmonary edema. Possible small left pleural effusion. Electronically Signed   By: Narda Rutherford M.D.   On: 03/11/2023 22:14   DG Pelvis Portable Result Date: 03/11/2023 CLINICAL DATA:  Blunt trauma, pain. EXAM: RIGHT FEMUR PORTABLE 1 VIEW; PORTABLE PELVIS 1-2 VIEWS COMPARISON:  None Available. FINDINGS: Pelvis: Displaced right femoral neck fracture. Proximal migration of the femoral shaft. No additional pelvic fracture. Pubic rami are intact. Pubic symphysis and sacroiliac joints are congruent. Femur: Frontal views of the right femur  obtained. Displaced femoral neck fracture with proximal migration of the femoral shaft. The distal femur is intact. Distal most femur including the knee not included in the field of view. IMPRESSION: Displaced right femoral neck fracture. Electronically Signed   By: Narda Rutherford M.D.   On: 03/11/2023 22:13   DG FEMUR PORT, 1V RIGHT Result Date: 03/11/2023 CLINICAL DATA:  Blunt trauma, pain. EXAM: RIGHT FEMUR PORTABLE 1 VIEW; PORTABLE PELVIS 1-2 VIEWS COMPARISON:  None Available. FINDINGS: Pelvis: Displaced right femoral neck fracture. Proximal migration of the femoral shaft. No additional pelvic fracture. Pubic rami are intact. Pubic symphysis and sacroiliac joints are congruent. Femur: Frontal views of the right femur obtained. Displaced femoral neck fracture with proximal migration of the femoral shaft. The distal femur is intact. Distal most femur including the knee not included in the field of view. IMPRESSION: Displaced right femoral neck fracture. Electronically Signed   By: Narda Rutherford M.D.   On: 03/11/2023 22:13           LOS: 1 day   Time spent= 35 mins    Miguel Rota, MD [facility]  If 7PM-7AM, please contact night-coverage  03/13/2023, 11:48 AM

## 2023-03-13 NOTE — H&P (View-Only) (Signed)
Reason for Consult: Right hip fracture Referring Physician: Nelson Chimes, MD (Hospitalist)  Jonathan Mathews is an 89 y.o. male.  HPI:   Jonathan Mathews is a 89 y.o. male with medical history significant of PAF, HFrEF (EF 30-35%), CAD, HTN, TIA, on pradaxa (or patient states he is on Plavix).   Pt walking outside to check mail and  slipped and fell on patch of ice.  Pain to R hip, RLE deformity.  No head injury nor LOC.   Past Surgical History:  Procedure Laterality Date   APPENDECTOMY     TONSILLECTOMY      History reviewed. No pertinent family history.  Social History:  reports that he has quit smoking. He has never used smokeless tobacco. He reports that he does not drink alcohol and does not use drugs.  Allergies:  Allergies  Allergen Reactions   Clindamycin/Lincomycin Diarrhea   Doxycycline Nausea And Vomiting    Gi upset    Pravastatin     Urinary retention   Zocor [Simvastatin]     myalgia   Ivp Dye [Iodinated Contrast Media] Rash    Medications: I have reviewed the patient's current medications. Scheduled:  amiodarone  400 mg Oral Daily   aspirin  81 mg Oral Daily   azelastine  1 spray Each Nare BID   carvedilol  6.25 mg Oral BID WC   clopidogrel  75 mg Oral Daily   enoxaparin (LOVENOX) injection  40 mg Subcutaneous Q24H   fluticasone furoate-vilanterol  1 puff Inhalation Daily   And   umeclidinium bromide  1 puff Inhalation Daily   montelukast  10 mg Oral QHS   pantoprazole  80 mg Oral Daily   rosuvastatin  10 mg Oral Daily   tamsulosin  0.4 mg Oral Daily    Results for orders placed or performed during the hospital encounter of 03/11/23 (from the past 24 hours)  Surgical PCR screen     Status: None   Collection Time: 03/13/23  3:22 AM   Specimen: Nasal Mucosa; Nasal Swab  Result Value Ref Range   MRSA, PCR NEGATIVE NEGATIVE   Staphylococcus aureus NEGATIVE NEGATIVE  CBC     Status: Abnormal   Collection Time: 03/13/23  3:46 AM  Result Value Ref Range    WBC 14.2 (H) 4.0 - 10.5 K/uL   RBC 3.93 (L) 4.22 - 5.81 MIL/uL   Hemoglobin 12.6 (L) 13.0 - 17.0 g/dL   HCT 24.4 01.0 - 27.2 %   MCV 101.5 (H) 80.0 - 100.0 fL   MCH 32.1 26.0 - 34.0 pg   MCHC 31.6 30.0 - 36.0 g/dL   RDW 53.6 (H) 64.4 - 03.4 %   Platelets 238 150 - 400 K/uL   nRBC 0.0 0.0 - 0.2 %  Basic metabolic panel     Status: Abnormal   Collection Time: 03/13/23  3:46 AM  Result Value Ref Range   Sodium 135 135 - 145 mmol/L   Potassium 3.9 3.5 - 5.1 mmol/L   Chloride 106 98 - 111 mmol/L   CO2 20 (L) 22 - 32 mmol/L   Glucose, Bld 146 (H) 70 - 99 mg/dL   BUN 29 (H) 8 - 23 mg/dL   Creatinine, Ser 7.42 0.61 - 1.24 mg/dL   Calcium 8.4 (L) 8.9 - 10.3 mg/dL   GFR, Estimated 58 (L) >60 mL/min   Anion gap 9 5 - 15  Magnesium     Status: None   Collection Time: 03/13/23  3:46 AM  Result Value Ref Range   Magnesium 2.4 1.7 - 2.4 mg/dL     X-ray: CLINICAL DATA:  Blunt trauma, pain.   EXAM: RIGHT FEMUR PORTABLE 1 VIEW; PORTABLE PELVIS 1-2 VIEWS   COMPARISON:  None Available.   FINDINGS: Pelvis: Displaced right femoral neck fracture. Proximal migration of the femoral shaft. No additional pelvic fracture. Pubic rami are intact. Pubic symphysis and sacroiliac joints are congruent.   Femur: Frontal views of the right femur obtained. Displaced femoral neck fracture with proximal migration of the femoral shaft. The distal femur is intact. Distal most femur including the knee not included in the field of view.   IMPRESSION: Displaced right femoral neck fracture.     Electronically Signed   By: Narda Rutherford M.D.  ROS: As per HPI  Blood pressure 124/72, pulse 69, temperature 97.6 F (36.4 C), resp. rate 15, height 5\' 8"  (1.727 m), weight 77.1 kg, SpO2 94%.  Physical Exam: Constitutional: NAD, calm, comfortable Respiratory: Normal respiratory effort. No accessory muscle use.  Cardiovascular: Regular rate and rhythm, no murmurs / rubs / gallops. No extremity edema. 2+  pedal pulses. No carotid bruits.  Abdomen: no tenderness, no masses palpated. No hepatosplenomegaly. Bowel sounds positive.  Musculoskeletal: R Hip TTP, slight shortening with external rotation Neurologic: CN 2-12 grossly intact. Sensation intact, DTR normal. Strength 5/5 in all 4.  Psychiatric: Normal judgment and insight. Alert and oriented x 3. Normal mood.  Assessment/Plan: 1.  Right femoral neck fracture  Plan: We will plan to perform a right total hip replacement for his right femoral neck fracture for acute management of his femoral neck fracture but also for minimizing long-term complications.  Surgery is scheduled for tomorrow.  Regular diet today.  N.p.o. after midnight.  Orders will be placed with consent. Postoperatively he will work with physical therapy to determine safety for home discharge versus needs otherwise.  [name] 03/13/2023, 8:04 AM

## 2023-03-13 NOTE — Consult Note (Signed)
Reason for Consult: Right hip fracture Referring Physician: Nelson Chimes, MD (Hospitalist)  Jonathan Mathews is an 89 y.o. male.  HPI:   Jonathan Mathews is a 89 y.o. male with medical history significant of PAF, HFrEF (EF 30-35%), CAD, HTN, TIA, on pradaxa (or patient states he is on Plavix).   Pt walking outside to check mail and  slipped and fell on patch of ice.  Pain to R hip, RLE deformity.  No head injury nor LOC.   Past Surgical History:  Procedure Laterality Date   APPENDECTOMY     TONSILLECTOMY      History reviewed. No pertinent family history.  Social History:  reports that he has quit smoking. He has never used smokeless tobacco. He reports that he does not drink alcohol and does not use drugs.  Allergies:  Allergies  Allergen Reactions   Clindamycin/Lincomycin Diarrhea   Doxycycline Nausea And Vomiting    Gi upset    Pravastatin     Urinary retention   Zocor [Simvastatin]     myalgia   Ivp Dye [Iodinated Contrast Media] Rash    Medications: I have reviewed the patient's current medications. Scheduled:  amiodarone  400 mg Oral Daily   aspirin  81 mg Oral Daily   azelastine  1 spray Each Nare BID   carvedilol  6.25 mg Oral BID WC   clopidogrel  75 mg Oral Daily   enoxaparin (LOVENOX) injection  40 mg Subcutaneous Q24H   fluticasone furoate-vilanterol  1 puff Inhalation Daily   And   umeclidinium bromide  1 puff Inhalation Daily   montelukast  10 mg Oral QHS   pantoprazole  80 mg Oral Daily   rosuvastatin  10 mg Oral Daily   tamsulosin  0.4 mg Oral Daily    Results for orders placed or performed during the hospital encounter of 03/11/23 (from the past 24 hours)  Surgical PCR screen     Status: None   Collection Time: 03/13/23  3:22 AM   Specimen: Nasal Mucosa; Nasal Swab  Result Value Ref Range   MRSA, PCR NEGATIVE NEGATIVE   Staphylococcus aureus NEGATIVE NEGATIVE  CBC     Status: Abnormal   Collection Time: 03/13/23  3:46 AM  Result Value Ref Range    WBC 14.2 (H) 4.0 - 10.5 K/uL   RBC 3.93 (L) 4.22 - 5.81 MIL/uL   Hemoglobin 12.6 (L) 13.0 - 17.0 g/dL   HCT 24.4 01.0 - 27.2 %   MCV 101.5 (H) 80.0 - 100.0 fL   MCH 32.1 26.0 - 34.0 pg   MCHC 31.6 30.0 - 36.0 g/dL   RDW 53.6 (H) 64.4 - 03.4 %   Platelets 238 150 - 400 K/uL   nRBC 0.0 0.0 - 0.2 %  Basic metabolic panel     Status: Abnormal   Collection Time: 03/13/23  3:46 AM  Result Value Ref Range   Sodium 135 135 - 145 mmol/L   Potassium 3.9 3.5 - 5.1 mmol/L   Chloride 106 98 - 111 mmol/L   CO2 20 (L) 22 - 32 mmol/L   Glucose, Bld 146 (H) 70 - 99 mg/dL   BUN 29 (H) 8 - 23 mg/dL   Creatinine, Ser 7.42 0.61 - 1.24 mg/dL   Calcium 8.4 (L) 8.9 - 10.3 mg/dL   GFR, Estimated 58 (L) >60 mL/min   Anion gap 9 5 - 15  Magnesium     Status: None   Collection Time: 03/13/23  3:46 AM  Result Value Ref Range   Magnesium 2.4 1.7 - 2.4 mg/dL     X-ray: CLINICAL DATA:  Blunt trauma, pain.   EXAM: RIGHT FEMUR PORTABLE 1 VIEW; PORTABLE PELVIS 1-2 VIEWS   COMPARISON:  None Available.   FINDINGS: Pelvis: Displaced right femoral neck fracture. Proximal migration of the femoral shaft. No additional pelvic fracture. Pubic rami are intact. Pubic symphysis and sacroiliac joints are congruent.   Femur: Frontal views of the right femur obtained. Displaced femoral neck fracture with proximal migration of the femoral shaft. The distal femur is intact. Distal most femur including the knee not included in the field of view.   IMPRESSION: Displaced right femoral neck fracture.     Electronically Signed   By: Narda Rutherford M.D.  ROS: As per HPI  Blood pressure 124/72, pulse 69, temperature 97.6 F (36.4 C), resp. rate 15, height 5\' 8"  (1.727 m), weight 77.1 kg, SpO2 94%.  Physical Exam: Constitutional: NAD, calm, comfortable Respiratory: Normal respiratory effort. No accessory muscle use.  Cardiovascular: Regular rate and rhythm, no murmurs / rubs / gallops. No extremity edema. 2+  pedal pulses. No carotid bruits.  Abdomen: no tenderness, no masses palpated. No hepatosplenomegaly. Bowel sounds positive.  Musculoskeletal: R Hip TTP, slight shortening with external rotation Neurologic: CN 2-12 grossly intact. Sensation intact, DTR normal. Strength 5/5 in all 4.  Psychiatric: Normal judgment and insight. Alert and oriented x 3. Normal mood.  Assessment/Plan: 1.  Right femoral neck fracture  Plan: We will plan to perform a right total hip replacement for his right femoral neck fracture for acute management of his femoral neck fracture but also for minimizing long-term complications.  Surgery is scheduled for tomorrow.  Regular diet today.  N.p.o. after midnight.  Orders will be placed with consent. Postoperatively he will work with physical therapy to determine safety for home discharge versus needs otherwise.  [name] 03/13/2023, 8:04 AM

## 2023-03-13 NOTE — Hospital Course (Addendum)
Brief Narrative:   89 y.o. male with medical history significant of PAF, HFrEF (EF 30-35%), CAD, HTN, TIA, on pradaxa.  Patient presented with fall and right hip pain and was admitted with closed displaced fracture of the right femoral neck and is also noted to have community-acquired pneumonia along with AKI on CKD 3.  P orthopedic requested patient to be transferred to [facility] long for surgery.  Cardiology team was consulted for clearance. Patient underwent arthroplasty of the right hip on 1/19, postop course was complicated by hypoxic respiratory failure combination of aspiration/fluid overload requiring Lasix.  This further caused acute kidney injury.  Overall his oxygen levels are slowly improving.   Assessment & Plan:  Principal Problem:   Closed displaced fracture of right femoral neck (HCC) Active Problems:   Acute respiratory failure with hypoxia (HCC)   Community acquired pneumonia   AKI (acute kidney injury) (HCC)   Chronic kidney disease, stage 3 (HCC)   Moderate COPD (chronic obstructive pulmonary disease) (HCC)   Panlobular emphysema (HCC)   Paroxysmal atrial fibrillation (HCC)   Syst congestive heart failure with reduced LV function, NYHA class 1 (HCC)   Right hip fracture, closed Status post arthroplasty 1/19. - Patient transferred from [facility] for surgical management per orthopedic recommendations.  Routine preop and postop management per orthopedic including pain control, wound care, DVT prophylaxis, therapy recommendations. PT potentially considering CIR  Acute worsening hypoxia - Still on 6 L nasal cannula.  Concerns of fluid overload versus aspiration postoperatively.  Today is the last day of azithromycin, complete 5 days of Unasyn (EOT 1/23).  Chest x-ray improved but still on 6 L nasal cannula.  Will give additional dose of Lasix -COVID, RSV, flu, respiratory viral panel-negative  At [facility] kidney injury on CKD stage IIIa - Baseline creatinine 1.5.   Creatinine peaked 2.15.  Improving  Congestive heart failure with reduced EF Nonischemic cardiomyopathy - Diuretics currently on hold due to AKI.  Cardiology team is following.  Recent cardiac cath showed nonobstructive CAD.  Currently chest pain-free.  Currently on Plavix, statin, Coreg  PVCs/frequent NSVT - On amiodarone.    COPD - Bronchodilators  After extensive discussion with patient's wife, daughter and rest of the family.  Patient is DNR/DNI  DVT prophylaxis:Lovenox  CODE STATUS-DNR/DNI Family Communication: Family at bedside Status is: Inpatient Remains inpatient appropriate because: Current hospital stay in stepdown unit  Subjective: Sitting up in the recliner, having significant amount of right hip pain Tells me he is not worried about breathing as much as his hip pain  Examination:  General exam: Appears calm and comfortable, 6 L nasal cannula Respiratory system: Clear to auscultation. Respiratory effort normal. Cardiovascular system: S1 & S2 heard, RRR. No JVD, murmurs, rubs, gallops or clicks. No pedal edema. Gastrointestinal system: Abdomen is nondistended, soft and nontender. No organomegaly or masses felt. Normal bowel sounds heard. Central nervous system: Alert and oriented to name only. No focal neurological deficits. Extremities: Symmetric 4 x 5 power. Limited ROM Skin: No rashes, lesions or ulcers.  Hip dressing noted without any evidence of active bleeding Psychiatry: Judgement and insight appear poor Foley catheter in place

## 2023-03-13 NOTE — Plan of Care (Signed)
  Problem: Education: Goal: Knowledge of General Education information will improve Description: Including pain rating scale, medication(s)/side effects and non-pharmacologic comfort measures Outcome: Progressing   Problem: Health Behavior/Discharge Planning: Goal: Ability to manage health-related needs will improve Outcome: Progressing   Problem: Clinical Measurements: Goal: Ability to maintain clinical measurements within normal limits will improve Outcome: Progressing Goal: Will remain free from infection Outcome: Progressing Goal: Diagnostic test results will improve Outcome: Progressing Goal: Respiratory complications will improve Outcome: Progressing Goal: Cardiovascular complication will be avoided Outcome: Progressing   Problem: Activity: Goal: Risk for activity intolerance will decrease Outcome: Progressing   Problem: Nutrition: Goal: Adequate nutrition will be maintained Outcome: Progressing   Problem: Coping: Goal: Level of anxiety will decrease Outcome: Progressing   Problem: Elimination: Goal: Will not experience complications related to bowel motility Outcome: Progressing Goal: Will not experience complications related to urinary retention Outcome: Progressing   Problem: Pain Managment: Goal: General experience of comfort will improve and/or be controlled Outcome: Progressing   Problem: Safety: Goal: Ability to remain free from injury will improve Outcome: Progressing   Problem: Skin Integrity: Goal: Risk for impaired skin integrity will decrease Outcome: Progressing   Problem: Activity: Goal: Ability to tolerate increased activity will improve Outcome: Progressing   Problem: Clinical Measurements: Goal: Ability to maintain a body temperature in the normal range will improve Outcome: Progressing   Problem: Respiratory: Goal: Ability to maintain adequate ventilation will improve Outcome: Progressing Goal: Ability to maintain a clear airway  will improve Outcome: Progressing   Problem: Education: Goal: Verbalization of understanding the information provided (i.e., activity precautions, restrictions, etc) will improve Outcome: Progressing Goal: Individualized Educational Video(s) Outcome: Progressing   Problem: Activity: Goal: Ability to ambulate and perform ADLs will improve Outcome: Progressing   Problem: Clinical Measurements: Goal: Postoperative complications will be avoided or minimized Outcome: Progressing   Problem: Self-Concept: Goal: Ability to maintain and perform role responsibilities to the fullest extent possible will improve Outcome: Progressing   Problem: Pain Management: Goal: Pain level will decrease Outcome: Progressing

## 2023-03-14 ENCOUNTER — Inpatient Hospital Stay (HOSPITAL_COMMUNITY): Payer: Self-pay | Admitting: Anesthesiology

## 2023-03-14 ENCOUNTER — Encounter (HOSPITAL_COMMUNITY)
Admission: EM | Disposition: A | Discharge status: Hospice | Payer: Self-pay | Source: Home / Self Care | Attending: Internal Medicine

## 2023-03-14 ENCOUNTER — Inpatient Hospital Stay (HOSPITAL_COMMUNITY): Payer: Medicare Other

## 2023-03-14 ENCOUNTER — Other Ambulatory Visit: Payer: Self-pay

## 2023-03-14 DIAGNOSIS — I110 Hypertensive heart disease with heart failure: Secondary | ICD-10-CM

## 2023-03-14 DIAGNOSIS — S72001A Fracture of unspecified part of neck of right femur, initial encounter for closed fracture: Secondary | ICD-10-CM | POA: Diagnosis not present

## 2023-03-14 DIAGNOSIS — I5020 Unspecified systolic (congestive) heart failure: Secondary | ICD-10-CM

## 2023-03-14 DIAGNOSIS — I2510 Atherosclerotic heart disease of native coronary artery without angina pectoris: Secondary | ICD-10-CM

## 2023-03-14 HISTORY — PX: TOTAL HIP ARTHROPLASTY: SHX124

## 2023-03-14 LAB — BASIC METABOLIC PANEL
Anion gap: 8 (ref 5–15)
BUN: 31 mg/dL — ABNORMAL HIGH (ref 8–23)
CO2: 22 mmol/L (ref 22–32)
Calcium: 8.6 mg/dL — ABNORMAL LOW (ref 8.9–10.3)
Chloride: 109 mmol/L (ref 98–111)
Creatinine, Ser: 1.51 mg/dL — ABNORMAL HIGH (ref 0.61–1.24)
GFR, Estimated: 44 mL/min — ABNORMAL LOW (ref >60)
Glucose, Bld: 105 mg/dL — ABNORMAL HIGH (ref 70–99)
Potassium: 3.7 mmol/L (ref 3.5–5.1)
Sodium: 139 mmol/L (ref 135–145)

## 2023-03-14 LAB — BRAIN NATRIURETIC PEPTIDE: B Natriuretic Peptide: 210 pg/mL — ABNORMAL HIGH (ref 0.0–100.0)

## 2023-03-14 LAB — GLUCOSE, CAPILLARY: Glucose-Capillary: 101 mg/dL — ABNORMAL HIGH (ref 70–99)

## 2023-03-14 LAB — CBC
HCT: 37.6 % — ABNORMAL LOW (ref 39.0–52.0)
Hemoglobin: 11.8 g/dL — ABNORMAL LOW (ref 13.0–17.0)
MCH: 32.2 pg (ref 26.0–34.0)
MCHC: 31.4 g/dL (ref 30.0–36.0)
MCV: 102.5 fL — ABNORMAL HIGH (ref 80.0–100.0)
Platelets: 231 10*3/uL (ref 150–400)
RBC: 3.67 MIL/uL — ABNORMAL LOW (ref 4.22–5.81)
RDW: 16.5 % — ABNORMAL HIGH (ref 11.5–15.5)
WBC: 14.3 10*3/uL — ABNORMAL HIGH (ref 4.0–10.5)
nRBC: 0 % (ref 0.0–0.2)

## 2023-03-14 LAB — MAGNESIUM: Magnesium: 2.6 mg/dL — ABNORMAL HIGH (ref 1.7–2.4)

## 2023-03-14 LAB — PHOSPHORUS: Phosphorus: 2.4 mg/dL — ABNORMAL LOW (ref 2.5–4.6)

## 2023-03-14 SURGERY — ARTHROPLASTY, HIP, TOTAL, ANTERIOR APPROACH
Anesthesia: General | Site: Hip | Laterality: Right

## 2023-03-14 MED ORDER — FUROSEMIDE 10 MG/ML IJ SOLN
40.0000 mg | Freq: Once | INTRAMUSCULAR | Status: AC
  Administered 2023-03-14: 40 mg via INTRAVENOUS
  Filled 2023-03-14: qty 4

## 2023-03-14 MED ORDER — KETAMINE HCL 10 MG/ML IJ SOLN
INTRAMUSCULAR | Status: DC | PRN
  Administered 2023-03-14: 20 mg via INTRAVENOUS

## 2023-03-14 MED ORDER — OXYCODONE HCL 5 MG PO TABS
5.0000 mg | ORAL_TABLET | ORAL | Status: DC | PRN
  Administered 2023-03-14 – 2023-03-19 (×9): 5 mg via ORAL
  Filled 2023-03-14 (×11): qty 1

## 2023-03-14 MED ORDER — ORAL CARE MOUTH RINSE: 15.0000 mL | OROMUCOSAL | Status: DC | PRN

## 2023-03-14 MED ORDER — METOCLOPRAMIDE HCL 5 MG PO TABS: 5.0000 mg | ORAL_TABLET | Freq: Three times a day (TID) | ORAL | Status: DC | PRN

## 2023-03-14 MED ORDER — CEFAZOLIN SODIUM-DEXTROSE 2-4 GM/100ML-% IV SOLN
2.0000 g | INTRAVENOUS | Status: AC
  Administered 2023-03-14: 2 g via INTRAVENOUS

## 2023-03-14 MED ORDER — ENSURE ENLIVE PO LIQD
237.0000 mL | Freq: Two times a day (BID) | ORAL | Status: DC
Start: 2023-03-14 — End: 2023-03-20
  Administered 2023-03-15 – 2023-03-20 (×7): 237 mL via ORAL

## 2023-03-14 MED ORDER — 0.9 % SODIUM CHLORIDE (POUR BTL) OPTIME
TOPICAL | Status: DC | PRN
  Administered 2023-03-14: 1000 mL

## 2023-03-14 MED ORDER — POTASSIUM PHOSPHATES 15 MMOLE/5ML IV SOLN
30.0000 mmol | Freq: Once | INTRAVENOUS | Status: AC
  Administered 2023-03-14: 30 mmol via INTRAVENOUS
  Filled 2023-03-14: qty 10

## 2023-03-14 MED ORDER — PROPOFOL 10 MG/ML IV BOLUS
INTRAVENOUS | Status: AC
  Filled 2023-03-14: qty 20

## 2023-03-14 MED ORDER — PHENYLEPHRINE HCL-NACL 20-0.9 MG/250ML-% IV SOLN
INTRAVENOUS | Status: DC | PRN
  Administered 2023-03-14: 25 ug/min via INTRAVENOUS

## 2023-03-14 MED ORDER — MENTHOL 3 MG MT LOZG
1.0000 | LOZENGE | OROMUCOSAL | Status: DC | PRN
Start: 2023-03-14 — End: 2023-03-20

## 2023-03-14 MED ORDER — SODIUM CHLORIDE (PF) 0.9 % IJ SOLN
INTRAMUSCULAR | Status: AC
  Filled 2023-03-14: qty 50

## 2023-03-14 MED ORDER — ACETAMINOPHEN 10 MG/ML IV SOLN
INTRAVENOUS | Status: DC | PRN
  Administered 2023-03-14: 1000 mg via INTRAVENOUS

## 2023-03-14 MED ORDER — SENNA 8.6 MG PO TABS
2.0000 | ORAL_TABLET | Freq: Every day | ORAL | Status: DC
  Administered 2023-03-14 – 2023-03-17 (×4): 17.2 mg via ORAL
  Filled 2023-03-14 (×4): qty 2

## 2023-03-14 MED ORDER — PHENYLEPHRINE 80 MCG/ML (10ML) SYRINGE FOR IV PUSH (FOR BLOOD PRESSURE SUPPORT)
PREFILLED_SYRINGE | INTRAVENOUS | Status: DC | PRN
  Administered 2023-03-14: 80 ug via INTRAVENOUS

## 2023-03-14 MED ORDER — BUPIVACAINE-EPINEPHRINE 0.25% -1:200000 IJ SOLN
INTRAMUSCULAR | Status: AC
  Filled 2023-03-14: qty 1

## 2023-03-14 MED ORDER — POLYETHYLENE GLYCOL 3350 17 G PO PACK: 17.0000 g | PACK | Freq: Two times a day (BID) | ORAL | Status: DC

## 2023-03-14 MED ORDER — METHOCARBAMOL 1000 MG/10ML IJ SOLN
500.0000 mg | Freq: Four times a day (QID) | INTRAMUSCULAR | Status: DC | PRN
  Administered 2023-03-17: 500 mg via INTRAVENOUS
  Filled 2023-03-14 (×2): qty 10

## 2023-03-14 MED ORDER — POVIDONE-IODINE 10 % EX SWAB
2.0000 | Freq: Once | CUTANEOUS | Status: DC
  Administered 2023-03-14: 2 via TOPICAL

## 2023-03-14 MED ORDER — CLOPIDOGREL BISULFATE 75 MG PO TABS
75.0000 mg | ORAL_TABLET | Freq: Every day | ORAL | Status: DC
  Administered 2023-03-15 – 2023-03-19 (×5): 75 mg via ORAL
  Filled 2023-03-14 (×5): qty 1

## 2023-03-14 MED ORDER — PHENYLEPHRINE HCL-NACL 20-0.9 MG/250ML-% IV SOLN
INTRAVENOUS | Status: AC
  Filled 2023-03-14: qty 250

## 2023-03-14 MED ORDER — DEXAMETHASONE SODIUM PHOSPHATE 4 MG/ML IJ SOLN
INTRAMUSCULAR | Status: DC | PRN
  Administered 2023-03-14: 8 mg via INTRAVENOUS

## 2023-03-14 MED ORDER — CHLORHEXIDINE GLUCONATE CLOTH 2 % EX PADS: 6.0000 | MEDICATED_PAD | Freq: Every day | CUTANEOUS | Status: DC

## 2023-03-14 MED ORDER — ALUM & MAG HYDROXIDE-SIMETH 200-200-20 MG/5ML PO SUSP: 30.0000 mL | ORAL | Status: DC | PRN

## 2023-03-14 MED ORDER — SODIUM CHLORIDE 0.9 % IV SOLN
3.0000 g | Freq: Four times a day (QID) | INTRAVENOUS | Status: DC
  Administered 2023-03-14 – 2023-03-15 (×4): 3 g via INTRAVENOUS
  Filled 2023-03-14 (×5): qty 8

## 2023-03-14 MED ORDER — KETOROLAC TROMETHAMINE 30 MG/ML IJ SOLN
INTRAMUSCULAR | Status: AC
  Filled 2023-03-14: qty 1

## 2023-03-14 MED ORDER — FENTANYL CITRATE (PF) 100 MCG/2ML IJ SOLN
INTRAMUSCULAR | Status: AC
  Filled 2023-03-14: qty 2

## 2023-03-14 MED ORDER — METHOCARBAMOL 500 MG PO TABS
500.0000 mg | ORAL_TABLET | Freq: Four times a day (QID) | ORAL | Status: DC | PRN
  Administered 2023-03-14 – 2023-03-20 (×5): 500 mg via ORAL
  Filled 2023-03-14 (×5): qty 1

## 2023-03-14 MED ORDER — CHLORHEXIDINE GLUCONATE 4 % EX SOLN
60.0000 mL | Freq: Once | CUTANEOUS | Status: DC
  Administered 2023-03-14: 4 via TOPICAL

## 2023-03-14 MED ORDER — ACETAMINOPHEN 10 MG/ML IV SOLN
INTRAVENOUS | Status: AC
  Filled 2023-03-14: qty 100

## 2023-03-14 MED ORDER — ACETAMINOPHEN 500 MG PO TABS
1000.0000 mg | ORAL_TABLET | Freq: Four times a day (QID) | ORAL | Status: AC
  Administered 2023-03-14 – 2023-03-18 (×13): 1000 mg via ORAL
  Filled 2023-03-14 (×16): qty 2

## 2023-03-14 MED ORDER — ACETAMINOPHEN 10 MG/ML IV SOLN: 1000.0000 mg | Freq: Once | INTRAVENOUS | Status: DC | PRN

## 2023-03-14 MED ORDER — KETOROLAC TROMETHAMINE 30 MG/ML IJ SOLN
INTRAMUSCULAR | Status: DC | PRN
  Administered 2023-03-14: 30 mg via INTRAVENOUS

## 2023-03-14 MED ORDER — SODIUM CHLORIDE (PF) 0.9 % IJ SOLN
INTRAMUSCULAR | Status: DC | PRN
  Administered 2023-03-14: 30 mL

## 2023-03-14 MED ORDER — BISACODYL 10 MG RE SUPP
10.0000 mg | Freq: Every day | RECTAL | Status: DC | PRN
Start: 2023-03-14 — End: 2023-03-20

## 2023-03-14 MED ORDER — FENTANYL CITRATE PF 50 MCG/ML IJ SOSY
PREFILLED_SYRINGE | INTRAMUSCULAR | Status: AC
  Filled 2023-03-14: qty 1

## 2023-03-14 MED ORDER — ADULT MULTIVITAMIN W/MINERALS CH
1.0000 | ORAL_TABLET | Freq: Every day | ORAL | Status: DC
  Administered 2023-03-14 – 2023-03-19 (×6): 1 via ORAL
  Filled 2023-03-14 (×6): qty 1

## 2023-03-14 MED ORDER — SODIUM CHLORIDE 0.9% FLUSH
3.0000 mL | INTRAVENOUS | Status: DC | PRN
Start: 2023-03-14 — End: 2023-03-14

## 2023-03-14 MED ORDER — DEXAMETHASONE SODIUM PHOSPHATE 10 MG/ML IJ SOLN
10.0000 mg | Freq: Once | INTRAMUSCULAR | Status: AC
  Administered 2023-03-15: 10 mg via INTRAVENOUS
  Filled 2023-03-14: qty 1

## 2023-03-14 MED ORDER — CEFAZOLIN SODIUM-DEXTROSE 2-4 GM/100ML-% IV SOLN
INTRAVENOUS | Status: AC
  Filled 2023-03-14: qty 100

## 2023-03-14 MED ORDER — STERILE WATER FOR IRRIGATION IR SOLN
Status: DC | PRN
  Administered 2023-03-14: 1000 mL

## 2023-03-14 MED ORDER — BUPIVACAINE-EPINEPHRINE (PF) 0.25% -1:200000 IJ SOLN
INTRAMUSCULAR | Status: DC | PRN
  Administered 2023-03-14: 30 mL via PERINEURAL

## 2023-03-14 MED ORDER — ROCURONIUM BROMIDE 100 MG/10ML IV SOLN
INTRAVENOUS | Status: DC | PRN
  Administered 2023-03-14: 50 mg via INTRAVENOUS

## 2023-03-14 MED ORDER — POLYETHYLENE GLYCOL 3350 17 G PO PACK
17.0000 g | PACK | Freq: Two times a day (BID) | ORAL | Status: DC
  Administered 2023-03-14 – 2023-03-19 (×6): 17 g via ORAL
  Filled 2023-03-14 (×9): qty 1

## 2023-03-14 MED ORDER — LIDOCAINE HCL (CARDIAC) PF 100 MG/5ML IV SOSY
PREFILLED_SYRINGE | INTRAVENOUS | Status: DC | PRN
  Administered 2023-03-14: 100 mg via INTRAVENOUS

## 2023-03-14 MED ORDER — KETAMINE HCL 50 MG/5ML IJ SOSY
PREFILLED_SYRINGE | INTRAMUSCULAR | Status: AC
  Filled 2023-03-14: qty 5

## 2023-03-14 MED ORDER — TRANEXAMIC ACID-NACL 1000-0.7 MG/100ML-% IV SOLN
INTRAVENOUS | Status: AC
  Filled 2023-03-14: qty 100

## 2023-03-14 MED ORDER — LACTATED RINGERS IV SOLN: INTRAVENOUS | Status: DC | PRN

## 2023-03-14 MED ORDER — DIPHENHYDRAMINE HCL 12.5 MG/5ML PO ELIX: 12.5000 mg | ORAL_SOLUTION | ORAL | Status: DC | PRN

## 2023-03-14 MED ORDER — ASPIRIN 81 MG PO CHEW
81.0000 mg | CHEWABLE_TABLET | Freq: Two times a day (BID) | ORAL | Status: DC
  Administered 2023-03-14: 81 mg via ORAL
  Filled 2023-03-14: qty 1

## 2023-03-14 MED ORDER — OXYCODONE HCL 5 MG PO TABS: 5.0000 mg | ORAL_TABLET | Freq: Once | ORAL | Status: DC | PRN

## 2023-03-14 MED ORDER — ONDANSETRON HCL 4 MG/2ML IJ SOLN
4.0000 mg | Freq: Four times a day (QID) | INTRAMUSCULAR | Status: DC | PRN
  Administered 2023-03-14: 4 mg via INTRAVENOUS
  Filled 2023-03-14: qty 2

## 2023-03-14 MED ORDER — TRANEXAMIC ACID-NACL 1000-0.7 MG/100ML-% IV SOLN
1000.0000 mg | INTRAVENOUS | Status: AC
  Administered 2023-03-14: 1000 mg via INTRAVENOUS

## 2023-03-14 MED ORDER — FENTANYL CITRATE PF 50 MCG/ML IJ SOSY
25.0000 ug | PREFILLED_SYRINGE | INTRAMUSCULAR | Status: DC | PRN
  Administered 2023-03-14 (×2): 50 ug via INTRAVENOUS

## 2023-03-14 MED ORDER — ONDANSETRON HCL 4 MG PO TABS
4.0000 mg | ORAL_TABLET | Freq: Four times a day (QID) | ORAL | Status: DC | PRN
Start: 2023-03-14 — End: 2023-03-20

## 2023-03-14 MED ORDER — METOCLOPRAMIDE HCL 5 MG/ML IJ SOLN: 5.0000 mg | Freq: Three times a day (TID) | INTRAMUSCULAR | Status: DC | PRN

## 2023-03-14 MED ORDER — ONDANSETRON HCL 4 MG/2ML IJ SOLN
INTRAMUSCULAR | Status: DC | PRN
  Administered 2023-03-14: 4 mg via INTRAVENOUS

## 2023-03-14 MED ORDER — HYDROMORPHONE HCL 1 MG/ML IJ SOLN
0.5000 mg | INTRAMUSCULAR | Status: DC | PRN
  Administered 2023-03-14 – 2023-03-20 (×14): 0.5 mg via INTRAVENOUS
  Filled 2023-03-14 (×5): qty 0.5
  Filled 2023-03-14: qty 1
  Filled 2023-03-14 (×3): qty 0.5
  Filled 2023-03-14: qty 1
  Filled 2023-03-14 (×4): qty 0.5

## 2023-03-14 MED ORDER — FENTANYL CITRATE (PF) 100 MCG/2ML IJ SOLN
INTRAMUSCULAR | Status: DC | PRN
  Administered 2023-03-14: 25 ug via INTRAVENOUS

## 2023-03-14 MED ORDER — PROPOFOL 10 MG/ML IV BOLUS
INTRAVENOUS | Status: DC | PRN
  Administered 2023-03-14: 50 mg via INTRAVENOUS

## 2023-03-14 MED ORDER — TRANEXAMIC ACID-NACL 1000-0.7 MG/100ML-% IV SOLN
1000.0000 mg | Freq: Once | INTRAVENOUS | Status: AC
  Administered 2023-03-14: 1000 mg via INTRAVENOUS
  Filled 2023-03-14: qty 100

## 2023-03-14 MED ORDER — SUGAMMADEX SODIUM 200 MG/2ML IV SOLN
INTRAVENOUS | Status: DC | PRN
  Administered 2023-03-14: 200 mg via INTRAVENOUS

## 2023-03-14 MED ORDER — SODIUM CHLORIDE 0.9% FLUSH
3.0000 mL | Freq: Two times a day (BID) | INTRAVENOUS | Status: DC
Start: 2023-03-14 — End: 2023-03-20
  Administered 2023-03-14 – 2023-03-15 (×2): 10 mL via INTRAVENOUS
  Administered 2023-03-15: 3 mL via INTRAVENOUS
  Administered 2023-03-16 – 2023-03-17 (×3): 5 mL via INTRAVENOUS
  Administered 2023-03-17: 3 mL via INTRAVENOUS
  Administered 2023-03-18: 10 mL via INTRAVENOUS
  Administered 2023-03-18: 3 mL via INTRAVENOUS
  Administered 2023-03-19 – 2023-03-20 (×3): 10 mL via INTRAVENOUS

## 2023-03-14 MED ORDER — PHENOL 1.4 % MT LIQD: 1.0000 | OROMUCOSAL | Status: DC | PRN

## 2023-03-14 MED ORDER — POTASSIUM PHOSPHATES 15 MMOLE/5ML IV SOLN: 30.0000 mmol | Freq: Once | INTRAVENOUS | Status: DC

## 2023-03-14 MED ORDER — ONDANSETRON HCL 4 MG/2ML IJ SOLN: 4.0000 mg | Freq: Once | INTRAMUSCULAR | Status: DC | PRN

## 2023-03-14 MED ORDER — OXYCODONE HCL 5 MG PO TABS
2.5000 mg | ORAL_TABLET | ORAL | Status: DC | PRN
  Administered 2023-03-15 – 2023-03-17 (×4): 2.5 mg via ORAL
  Filled 2023-03-14 (×3): qty 1

## 2023-03-14 MED ORDER — OXYCODONE HCL 5 MG/5ML PO SOLN: 5.0000 mg | Freq: Once | ORAL | Status: DC | PRN

## 2023-03-14 SURGICAL SUPPLY — 40 items
ARTICULEZE HEAD (Hips) ×1 IMPLANT
BAG COUNTER SPONGE SURGICOUNT (BAG) IMPLANT
BAG ZIPLOCK 12X15 (MISCELLANEOUS) IMPLANT
BLADE SAG 18X100X1.27 (BLADE) ×1 IMPLANT
COVER PERINEAL POST (MISCELLANEOUS) ×1 IMPLANT
COVER SURGICAL LIGHT HANDLE (MISCELLANEOUS) ×1 IMPLANT
CUP ACETBLR 52 OD PINNACLE (Hips) IMPLANT
DERMABOND ADVANCED .7 DNX12 (GAUZE/BANDAGES/DRESSINGS) ×1 IMPLANT
DRAPE FOOT SWITCH (DRAPES) ×1 IMPLANT
DRAPE STERI IOBAN 125X83 (DRAPES) ×1 IMPLANT
DRAPE U-SHAPE 47X51 STRL (DRAPES) ×2 IMPLANT
DRESSING AQUACEL AG SP 3.5X10 (GAUZE/BANDAGES/DRESSINGS) ×1 IMPLANT
DRSG AQUACEL AG ADV 3.5X10 (GAUZE/BANDAGES/DRESSINGS) IMPLANT
DRSG AQUACEL AG SP 3.5X10 (GAUZE/BANDAGES/DRESSINGS) ×1
DURAPREP 26ML APPLICATOR (WOUND CARE) ×1 IMPLANT
ELECT REM PT RETURN 15FT ADLT (MISCELLANEOUS) ×1 IMPLANT
GLOVE BIO SURGEON STRL SZ 6 (GLOVE) ×1 IMPLANT
GLOVE BIOGEL PI IND STRL 6.5 (GLOVE) ×1 IMPLANT
GLOVE BIOGEL PI IND STRL 7.5 (GLOVE) ×1 IMPLANT
GLOVE ORTHO TXT STRL SZ7.5 (GLOVE) ×2 IMPLANT
GOWN STRL REUS W/ TWL LRG LVL3 (GOWN DISPOSABLE) ×2 IMPLANT
HEAD ARTICULEZE (Hips) IMPLANT
HOLDER FOLEY CATH W/STRAP (MISCELLANEOUS) ×1 IMPLANT
KIT TURNOVER KIT A (KITS) IMPLANT
LINER NEUTRAL 52X36MM PLUS 4 (Liner) IMPLANT
MANIFOLD NEPTUNE II (INSTRUMENTS) ×1 IMPLANT
NDL SAFETY ECLIPSE 18X1.5 (NEEDLE) IMPLANT
PACK ANTERIOR HIP CUSTOM (KITS) ×1 IMPLANT
SCREW 6.5MMX25MM (Screw) IMPLANT
STEM FEMORAL SZ5 HIGH ACTIS (Stem) IMPLANT
SUT MNCRL AB 4-0 PS2 18 (SUTURE) ×1 IMPLANT
SUT STRATAFIX 0 PDS 27 VIOLET (SUTURE) ×1
SUT VIC AB 1 CT1 36 (SUTURE) ×3 IMPLANT
SUT VIC AB 2-0 CT1 TAPERPNT 27 (SUTURE) ×2 IMPLANT
SUTURE STRATFX 0 PDS 27 VIOLET (SUTURE) ×1 IMPLANT
SYR 3ML LL SCALE MARK (SYRINGE) IMPLANT
TOWEL GREEN STERILE FF (TOWEL DISPOSABLE) ×1 IMPLANT
TRAY FOLEY MTR SLVR 16FR STAT (SET/KITS/TRAYS/PACK) ×1 IMPLANT
TUBE SUCTION HIGH CAP CLEAR NV (SUCTIONS) ×1 IMPLANT
WATER STERILE IRR 1000ML POUR (IV SOLUTION) ×1 IMPLANT

## 2023-03-14 NOTE — Plan of Care (Signed)
  Problem: Education: Goal: Knowledge of General Education information will improve Description: Including pain rating scale, medication(s)/side effects and non-pharmacologic comfort measures Outcome: Progressing   Problem: Clinical Measurements: Goal: Will remain free from infection Outcome: Progressing Goal: Diagnostic test results will improve Outcome: Progressing Goal: Respiratory complications will improve Outcome: Progressing Goal: Cardiovascular complication will be avoided Outcome: Progressing   Problem: Nutrition: Goal: Adequate nutrition will be maintained Outcome: Progressing   Problem: Elimination: Goal: Will not experience complications related to urinary retention Outcome: Progressing   Problem: Pain Managment: Goal: General experience of comfort will improve and/or be controlled Outcome: Progressing   Problem: Safety: Goal: Ability to remain free from injury will improve Outcome: Progressing   Problem: Skin Integrity: Goal: Risk for impaired skin integrity will decrease Outcome: Progressing   Problem: Clinical Measurements: Goal: Ability to maintain a body temperature in the normal range will improve Outcome: Progressing   Problem: Respiratory: Goal: Ability to maintain adequate ventilation will improve Outcome: Progressing Goal: Ability to maintain a clear airway will improve Outcome: Progressing   Problem: Pain Management: Goal: Pain level will decrease Outcome: Progressing

## 2023-03-14 NOTE — Anesthesia Postprocedure Evaluation (Signed)
Anesthesia Post Note  Patient: Jonathan Mathews  Procedure(s) Performed: TOTAL HIP ARTHROPLASTY ANTERIOR APPROACH (Right: Hip)     Patient location during evaluation: PACU Anesthesia Type: General Level of consciousness: awake and alert Pain management: pain level controlled Vital Signs Assessment: post-procedure vital signs reviewed and stable Respiratory status: spontaneous breathing, nonlabored ventilation, respiratory function stable and non-rebreather facemask Cardiovascular status: blood pressure returned to baseline and stable Postop Assessment: no apparent nausea or vomiting Anesthetic complications: no   No notable events documented.  Last Vitals:  Vitals:   03/14/23 1004 03/14/23 1005  BP: 101/88 101/88  Pulse: 65 69  Resp: 13 14  Temp:    SpO2: 95% 91%    Last Pain:  Vitals:   03/14/23 1000  TempSrc:   PainSc: Asleep                 Mariann Barter

## 2023-03-14 NOTE — Progress Notes (Signed)
Pharmacy Antibiotic Note  Jonathan Mathews is a 89 y.o. male admitted on 03/11/2023 with  aspiration pneumonia .  Pharmacy has been consulted for Unasyn dosing.  Plan: Unasyn 3g IV q6 per current renal function  Height: 5\' 8"  (172.7 cm) Weight: 77.1 kg (170 lb) IBW/kg (Calculated) : 68.4  Temp (24hrs), Avg:97.8 F (36.6 C), Min:97.5 F (36.4 C), Max:98 F (36.7 C)  Recent Labs  Lab 03/11/23 2148 03/11/23 2202 03/13/23 0346 03/14/23 0311  WBC 11.6*  --  14.2* 14.3*  CREATININE 1.88* 2.00* 1.21 1.51*  LATICACIDVEN  --  1.1  --   --     Estimated Creatinine Clearance: 33.3 mL/min (A) (by C-G formula based on SCr of 1.51 mg/dL (H)).    Allergies  Allergen Reactions   Clindamycin/Lincomycin Diarrhea   Doxycycline Nausea And Vomiting    Gi upset    Pravastatin     Urinary retention   Zocor [Simvastatin]     myalgia   Ivp Dye [Iodinated Contrast Media] Rash      Thank you for allowing pharmacy to be a part of this patient's care.  Berkley Harvey 03/14/2023 10:33 AM

## 2023-03-14 NOTE — Progress Notes (Signed)
Just received call from central telemetry that at 0207 patient had a 4 beat run of unsustained V-tach. Primary nurse, Boyd Kerbs made aware.

## 2023-03-14 NOTE — Progress Notes (Signed)
PROGRESS NOTE    Jonathan Mathews  WGN:[id number] DOB: 22-Apr-1933 DOA: 03/11/2023 PCP: [facility], DO    Brief Narrative:   89 y.o. male with medical history significant of PAF, HFrEF (EF 30-35%), CAD, HTN, TIA, on pradaxa.  Patient presented with fall and right hip pain and was admitted with closed displaced fracture of the right femoral neck and is also noted to have community-acquired pneumonia along with AKI on CKD 3.  P orthopedic requested patient to be transferred to [facility] long for surgery.  Cardiology team was consulted for clearance.   Assessment & Plan:  Principal Problem:   Closed displaced fracture of right femoral neck (HCC) Active Problems:   Acute respiratory failure with hypoxia (HCC)   Community acquired pneumonia   AKI (acute kidney injury) (HCC)   Chronic kidney disease, stage 3 (HCC)   Moderate COPD (chronic obstructive pulmonary disease) (HCC)   Panlobular emphysema (HCC)   Paroxysmal atrial fibrillation (HCC)   Syst congestive heart failure with reduced LV function, NYHA class 1 (HCC)   Right hip fracture, closed Status post arthroplasty 1/19. - Patient transferred from [facility] for surgical management per orthopedic recommendations.  Routine preop and postop management per orthopedic including pain control, wound care, DVT prophylaxis, therapy recommendations.  Acute worsening hypoxia - Diagnosis combination of aspiration pneumonia versus fluid overload especially postop.  Will change antibiotics to Unasyn, continue azithromycin.  Bronchodilators.  I-S/flutter valve.  Currently on 15 L nonrebreather, will slowly attempt to wean this off.  Stat chest x-ray shows possible concerns of fluid therefore will give 40 mg of IV Lasix as blood pressure tolerates.  I will also notify pulmonary in case if patient decompensate in the setting of soft blood pressure. -COVID, RSV, flu, respiratory viral panel-negative  Mild renal insufficiency on CKD stage  IIIa - Baseline creatinine 1.5.  Creatinine peaked at 2.0  Congestive heart failure with reduced EF Nonischemic cardiomyopathy - Diuretics currently on hold due to AKI.  Cardiology team is following.  Recent cardiac cath showed nonobstructive CAD.  Currently chest pain-free.  Currently on aspirin, Plavix, statin, Coreg  PVCs/frequent NSVT - On amiodarone.    COPD - Bronchodilators  Had goals of care discussion with patient's wife, family at bedside.  They understand patient is currently in somewhat critical situation.  They there gree patient should be DNR/DNI  DVT prophylaxis: enoxaparin (LOVENOX) injection 40 mg Start: 03/12/23 1600 SCDs Start: 03/12/23 0535  CODE STATUS-DNR/DNI Family Communication: Family updated at bedside Status is: Inpatient Remains inpatient appropriate because: Current hospital stay in stepdown unit  Subjective: Patient was on 5 L nasal cannula this morning, underwent ORIF.  Postop nursing staff notified me that patient was hypoxic requiring 15 L nasal cannula and had soft blood pressure.  He was also altered which could have been secondary to anesthesia, hypoxia and delirium.  Chest x-ray performed which showed signs of fluid overload versus aspiration.  Medications have been adjusted.  Also discussed case with patient's family members at bedside.   Examination:  General exam: Appears calm and comfortable, 15 L nonrebreather Respiratory system: Clear to auscultation. Respiratory effort normal. Cardiovascular system: S1 & S2 heard, RRR. No JVD, murmurs, rubs, gallops or clicks. No pedal edema. Gastrointestinal system: Abdomen is nondistended, soft and nontender. No organomegaly or masses felt. Normal bowel sounds heard. Central nervous system: Alert and oriented to name only. No focal neurological deficits. Extremities: Symmetric 4 x 5 power. Limited ROM Skin: No rashes, lesions or ulcers  Psychiatry: Judgement and insight appear poor Foley catheter in  place               Diet Orders (From admission, onward)     Start     Ordered   03/14/23 1038  Diet regular Room service appropriate? Yes; Fluid consistency: Thin  Diet effective now       Question Answer Comment  Room service appropriate? Yes   Fluid consistency: Thin      03/14/23 1037            Objective: Vitals:   03/14/23 1000 03/14/23 1002 03/14/23 1004 03/14/23 1005  BP: (!) 83/50 (!) 83/50 101/88 101/88  Pulse: 68 69 65 69  Resp: 11 12 13 14   Temp: 97.9 F (36.6 C)     TempSrc:      SpO2: 92% 93% 95% 91%  Weight:      Height:        Intake/Output Summary (Last 24 hours) at 03/14/2023 1122 Last data filed at 03/14/2023 0857 Gross per 24 hour  Intake 636.01 ml  Output 1550 ml  Net -913.99 ml   Filed Weights   03/11/23 2158  Weight: 77.1 kg    Scheduled Meds:  acetaminophen  1,000 mg Oral Q6H   amiodarone  400 mg Oral Daily   aspirin  81 mg Oral BID   azelastine  1 spray Each Nare BID   carvedilol  6.25 mg Oral BID WC   Chlorhexidine Gluconate Cloth  6 each Topical Daily   [START ON 03/15/2023] clopidogrel  75 mg Oral Daily   [START ON 03/15/2023] dexamethasone (DECADRON) injection  10 mg Intravenous Once   fentaNYL       fentaNYL       fluticasone furoate-vilanterol  1 puff Inhalation Daily   And   umeclidinium bromide  1 puff Inhalation Daily   montelukast  10 mg Oral QHS   pantoprazole  80 mg Oral Daily   polyethylene glycol  17 g Oral BID   rosuvastatin  10 mg Oral Daily   senna  2 tablet Oral QHS   sodium chloride flush  3-10 mL Intravenous Q12H   tamsulosin  0.4 mg Oral Daily   Continuous Infusions:  ampicillin-sulbactam (UNASYN) IV     azithromycin 500 mg (03/13/23 2352)   potassium PHOSPHATE 30 mmol in dextrose 5 % 250 mL infusion     tranexamic acid      Nutritional status     Body mass index is 25.85 kg/m.  Data Reviewed:   CBC: Recent Labs  Lab 03/11/23 2148 03/11/23 2202 03/13/23 0346 03/14/23 0311  WBC  11.6*  --  14.2* 14.3*  HGB 14.1 15.0 12.6* 11.8*  HCT 43.2 44.0 39.9 37.6*  MCV 99.1  --  101.5* 102.5*  PLT 269  --  238 231   Basic Metabolic Panel: Recent Labs  Lab 03/11/23 2148 03/11/23 2202 03/13/23 0346 03/14/23 0311  NA 137 141 135 139  K 3.6 3.9 3.9 3.7  CL 107 106 106 109  CO2 22  --  20* 22  GLUCOSE 138* 135* 146* 105*  BUN 23 30* 29* 31*  CREATININE 1.88* 2.00* 1.21 1.51*  CALCIUM 8.6*  --  8.4* 8.6*  MG  --   --  2.4 2.6*  PHOS  --   --   --  2.4*   GFR: Estimated Creatinine Clearance: 33.3 mL/min (A) (by C-G formula based on SCr of 1.51 mg/dL (H)). Liver Function Tests:  Recent Labs  Lab 03/11/23 2148  AST 39  ALT 44  ALKPHOS 80  BILITOT 1.5*  PROT 5.8*  ALBUMIN 3.4*   No results for input(s): "LIPASE", "AMYLASE" in the last 168 hours. No results for input(s): "AMMONIA" in the last 168 hours. Coagulation Profile: Recent Labs  Lab 03/11/23 2148  INR 1.1   Cardiac Enzymes: No results for input(s): "CKTOTAL", "CKMB", "CKMBINDEX", "TROPONINI" in the last 168 hours. BNP (last 3 results) No results for input(s): "PROBNP" in the last 8760 hours. HbA1C: No results for input(s): "HGBA1C" in the last 72 hours. CBG: No results for input(s): "GLUCAP" in the last 168 hours. Lipid Profile: No results for input(s): "CHOL", "HDL", "LDLCALC", "TRIG", "CHOLHDL", "LDLDIRECT" in the last 72 hours. Thyroid Function Tests: No results for input(s): "TSH", "T4TOTAL", "FREET4", "T3FREE", "THYROIDAB" in the last 72 hours. Anemia Panel: No results for input(s): "VITAMINB12", "FOLATE", "FERRITIN", "TIBC", "IRON", "RETICCTPCT" in the last 72 hours. Sepsis Labs: Recent Labs  Lab 03/11/23 2202 03/13/23 0322  PROCALCITON  --  0.11  LATICACIDVEN 1.1  --     Recent Results (from the past 240 hours)  Resp panel by RT-PCR (RSV, Flu A&B, Covid) Urine, Clean Catch     Status: None   Collection Time: 03/12/23  3:45 AM   Specimen: Urine, Clean Catch; Nasal Swab  Result  Value Ref Range Status   SARS Coronavirus 2 by RT PCR NEGATIVE NEGATIVE Final   Influenza A by PCR NEGATIVE NEGATIVE Final   Influenza B by PCR NEGATIVE NEGATIVE Final    Comment: (NOTE) The Xpert Xpress SARS-CoV-2/FLU/RSV plus assay is intended as an aid in the diagnosis of influenza from Nasopharyngeal swab specimens and should not be used as a sole basis for treatment. Nasal washings and aspirates are unacceptable for Xpert Xpress SARS-CoV-2/FLU/RSV testing.  Fact Sheet for Patients: BloggerCourse.com  Fact Sheet for Healthcare Providers: SeriousBroker.it  This test is not yet approved or cleared by the [place] FDA and has been authorized for detection and/or diagnosis of SARS-CoV-2 by FDA under an Emergency Use Authorization (EUA). This EUA will remain in effect (meaning this test can be used) for the duration of the COVID-19 declaration under Section 564(b)(1) of the Act, 21 U.S.C. section 360bbb-3(b)(1), unless the authorization is terminated or revoked.     Resp Syncytial Virus by PCR NEGATIVE NEGATIVE Final    Comment: (NOTE) Fact Sheet for Patients: BloggerCourse.com  Fact Sheet for Healthcare Providers: SeriousBroker.it  This test is not yet approved or cleared by the [place] FDA and has been authorized for detection and/or diagnosis of SARS-CoV-2 by FDA under an Emergency Use Authorization (EUA). This EUA will remain in effect (meaning this test can be used) for the duration of the COVID-19 declaration under Section 564(b)(1) of the Act, 21 U.S.C. section 360bbb-3(b)(1), unless the authorization is terminated or revoked.  Performed at [facility], [address], [place], [place] [address]   Respiratory (~20 pathogens) panel by PCR     Status: None   Collection Time: 03/12/23  6:24 AM   Specimen: Nasopharyngeal Swab; Respiratory  Result Value  Ref Range Status   Adenovirus NOT DETECTED NOT DETECTED Final   Coronavirus 229E NOT DETECTED NOT DETECTED Final    Comment: (NOTE) The Coronavirus on the Respiratory Panel, DOES NOT test for the novel  Coronavirus (2019 nCoV)    Coronavirus HKU1 NOT DETECTED NOT DETECTED Final   Coronavirus NL63 NOT DETECTED NOT DETECTED Final   Coronavirus OC43 NOT  DETECTED NOT DETECTED Final   Metapneumovirus NOT DETECTED NOT DETECTED Final   Rhinovirus / Enterovirus NOT DETECTED NOT DETECTED Final   Influenza A NOT DETECTED NOT DETECTED Final   Influenza B NOT DETECTED NOT DETECTED Final   Parainfluenza Virus 1 NOT DETECTED NOT DETECTED Final   Parainfluenza Virus 2 NOT DETECTED NOT DETECTED Final   Parainfluenza Virus 3 NOT DETECTED NOT DETECTED Final   Parainfluenza Virus 4 NOT DETECTED NOT DETECTED Final   Respiratory Syncytial Virus NOT DETECTED NOT DETECTED Final   Bordetella pertussis NOT DETECTED NOT DETECTED Final   Bordetella Parapertussis NOT DETECTED NOT DETECTED Final   Chlamydophila pneumoniae NOT DETECTED NOT DETECTED Final   Mycoplasma pneumoniae NOT DETECTED NOT DETECTED Final    Comment: Performed at [facility], [address], [place], [place] [address]  SARS Coronavirus 2 by RT PCR (hospital order, performed in [facility] hospital lab) *cepheid single result test* Nasopharyngeal Swab     Status: None   Collection Time: 03/12/23  6:24 AM   Specimen: Nasopharyngeal Swab; Nasal Swab  Result Value Ref Range Status   SARS Coronavirus 2 by RT PCR NEGATIVE NEGATIVE Final    Comment: Performed at [facility], [address] [place], [place] [address]  Surgical PCR screen     Status: None   Collection Time: 03/13/23  3:22 AM   Specimen: Nasal Mucosa; Nasal Swab  Result Value Ref Range Status   MRSA, PCR NEGATIVE NEGATIVE Final   Staphylococcus aureus NEGATIVE NEGATIVE Final    Comment: (NOTE) The Xpert SA Assay (FDA approved for NASAL specimens in patients 22 years  of age and older), is one component of a comprehensive surveillance program. It is not intended to diagnose infection nor to guide or monitor treatment. Performed at [facility], [address], [place], [place] [address]          Radiology Studies: DG Chest Port 1 View Result Date: 03/14/2023 CLINICAL DATA:  Shortness of breath EXAM: PORTABLE CHEST 1 VIEW COMPARISON:  03/11/2023 FINDINGS: Persistent pulmonary infiltrates which are diffuse, but with some improvement in the right lower lobe and right middle lobe since the study of 3 days ago. No worsening or new finding IMPRESSION: Persistent diffuse pulmonary infiltrates, but with some improvement in the right lower lobe and right middle lobe since the study of 3 days ago. Electronically Signed   By: Paulina Fusi M.D.   On: 03/14/2023 11:04   DG HIP UNILAT WITH PELVIS 1V RIGHT Result Date: 03/14/2023 CLINICAL DATA:  Fluoroscopy provided for right hip arthroplasty. EXAM: OPERATIVE RIGHT HIP (WITH PELVIS IF PERFORMED) 3 VIEWS TECHNIQUE: Fluoroscopic spot image(s) were submitted for interpretation post-operatively. Dose: 1.1 mGy. COMPARISON:  03/11/2023. FINDINGS: Submitted images show resection of the right femoral head and neck placement the femoral and acetabular prosthetic components. IMPRESSION: Fluoroscopy provided for right hip arthroplasty. Please refer to the procedure report for further details. Electronically Signed   By: Amie Portland M.D.   On: 03/14/2023 10:12   X-ray pelvis complete Result Date: 03/14/2023 CLINICAL DATA:  Postop right hip arthroplasty. EXAM: PELVIS - 1-2 VIEW COMPARISON:  03/11/2023. FINDINGS: New right hip total arthroplasty appears well seated and well aligned. No acute fracture or evidence of an operative complication. SI joints, pubic symphysis and left hip joint are normally spaced and aligned. Right sided soft tissue edema and air from the recent surgery. IMPRESSION: Well-positioned right hip  total arthroplasty. Electronically Signed   By:  Amie Portland M.D.   On: 03/14/2023 10:10   DG C-Arm 1-60 Min-No Report Result Date: 03/14/2023 Fluoroscopy was utilized by the requesting physician.  No radiographic interpretation.   DG C-Arm 1-60 Min-No Report Result Date: 03/14/2023 Fluoroscopy was utilized by the requesting physician.  No radiographic interpretation.           LOS: 2 days   Critical care time spent : 35 minutes examining the patient, discussing with RN, consultants as needed, coordinating care and management.The medical decision making on this patient was of high complexity, the critically ill patient is at high risk for clinical deterioration.  Critical care time was exclusive of separately billable procedures and treating other patients. Critical care was necessary to treat or prevent imminent or life-threatening deterioration. Critical care was time spent personally by me on the following activities: development of treatment plan with patient and/or surrogate as well as nursing, discussions with consultants, evaluation of patient's response to treatment, examination of patient, obtaining history from patient or surrogate, ordering and performing treatments and interventions, ordering and review of laboratory studies, ordering and review of radiographic studies, pulse oximetry and re-evaluation of patient's condition.    Miguel Rota, MD [facility]  If 7PM-7AM, please contact night-coverage  03/14/2023, 11:22 AM

## 2023-03-14 NOTE — Transfer of Care (Signed)
Immediate Anesthesia Transfer of Care Note  Patient: Jonathan Mathews  Procedure(s) Performed: TOTAL HIP ARTHROPLASTY ANTERIOR APPROACH (Right: Hip)  Patient Location: PACU  Anesthesia Type:General  Level of Consciousness: awake, alert , and oriented  Airway & Oxygen Therapy: Patient Spontanous Breathing and Patient connected to face mask oxygen  Post-op Assessment: Report given to RN and Post -op Vital signs reviewed and stable  Post vital signs: Reviewed and stable  Last Vitals:  Vitals Value Taken Time  BP 125/114 03/14/23 0920  Temp    Pulse 75 03/14/23 0921  Resp 21 03/14/23 0921  SpO2 85 % 03/14/23 0921  Vitals shown include unfiled device data.  Last Pain:  Vitals:   03/14/23 0650  TempSrc:   PainSc: 4       Patients Stated Pain Goal: 2 (03/14/23 0422)  Complications: No notable events documented.

## 2023-03-14 NOTE — Op Note (Signed)
NAME:  Jonathan Mathews                ACCOUNT NO.: [id number]      MEDICAL RECORD NO.: [id number]      FACILITY:  [facility]      PHYSICIAN:  [name]  DATE OF BIRTH:  08-27-1933     DATE OF PROCEDURE:  03/14/2023                                 OPERATIVE REPORT         PREOPERATIVE DIAGNOSIS: Right  hip femoral neck fracture.      POSTOPERATIVE DIAGNOSIS:  Right hip femoral neck fracture.      PROCEDURE:  Right total hip replacement through an anterior approach   utilizing DePuy THR system, component size 52 mm pinnacle cup, a size 36+4 neutral   Altrex liner, a size 5 Hi Actis stem with a 36+5 Articuleze metal head ball.      SURGEON:  Madlyn Frankel. Charlann Boxer, M.D.      ASSISTANT:  Rosalene Billings, PA-C     ANESTHESIA:  General.      SPECIMENS:  None.      COMPLICATIONS:  None.      BLOOD LOSS:  400 cc     DRAINS:  None.      INDICATION OF THE PROCEDURE:  Jonathan Mathews is a 89 y.o. male who slipped on ice.  He fell directly onto his right hip.  Due to the inability to bear weight he was brought to the emergency room where radiographs revealed a displaced femoral neck fracture.  Based on the nature of the injury, his baseline activity level I recommended proceeding with a right total hip replacement to try to minimize any needs for future surgery.  Risks of infection DVT dislocation neurovascular injury known.  Need for surgery reviewed.  Preoperatively he was also found to have a pneumonia for which she is currently being treated while in the hospital.  Performing total hip replacement will allow for pain control as well as assistance with his mobility which will be important for management of his current pneumonia.  Consent obtained.  NPO status verified.     PROCEDURE IN DETAIL:  The patient was brought to operative theater.   Once adequate anesthesia, preoperative antibiotics have been administered on the floor due to diagnosis of pneumonia, 1 gm of  Tranexamic Acid, and 10 mg of Decadron were administered, the patient was positioned supine on the [employer] table.  Once the patient was safely positioned with adequate padding of boney prominences we predraped out the hip, and used fluoroscopy to confirm orientation of the pelvis.      The right hip was then prepped and draped from proximal iliac crest to   mid thigh with a shower curtain technique.      Time-out was performed identifying the patient, planned procedure, and the appropriate extremity.     An incision was then made 2 cm lateral to the   anterior superior iliac spine extending over the orientation of the   tensor fascia lata muscle and sharp dissection was carried down to the   fascia of the muscle.      The fascia was then incised.  The muscle belly was identified and swept   laterally and retractor placed along the superior neck.  Following   cauterization of  the circumflex vessels and removing some pericapsular   fat, a second cobra retractor was placed on the inferior neck.  A T-capsulotomy was made along the line of the   superior neck to the trochanteric fossa, then extended proximally and   distally.  Tag sutures were placed and the retractors were then placed   intracapsular.  We then identified the trochanteric fossa and   orientation of my neck cut and then made a neck osteotomy with the femur on traction.  The fractured femoral neck segment and the femoral head were removed without difficulty or complication.  Traction was let   off and retractors were placed posterior and anterior around the   acetabulum.      The labrum and foveal tissue were debrided.  I began reaming with a 48 mm   reamer and reamed up to 51 mm reamer with good bony bed preparation and a 52 mm  cup was chosen.  The final 52 mm Pinnacle cup was then impacted under fluoroscopy to confirm the depth of penetration and orientation with respect to   Abduction and forward flexion.  A screw was placed  into the ilium followed by the hole eliminator.  The final   36+4 neutral Altrex liner was impacted with good visualized rim fit.  The cup was positioned anatomically within the acetabular portion of the pelvis.      At this point, the femur was rolled to 100 degrees.  Further capsule was   released off the inferior aspect of the femoral neck.  I then   released the superior capsule proximally.  With the leg in a neutral position the hook was placed laterally   along the femur under the vastus lateralis origin and elevated manually and then held in position using the hook attachment on the bed.  The leg was then extended and adducted with the leg rolled to 100   degrees of external rotation.  Retractors were placed along the medial calcar and posteriorly over the greater trochanter.  Once the proximal femur was fully   exposed, I used a box osteotome to set orientation.  I then began   broaching with the starting chili pepper broach and passed this by hand and then broached up to 5.  With the 5 broach in place I chose a high offset neck and did several trial reductions.  The offset was appropriate, leg lengths   appeared to be equal best matched with the +5 head ball trial confirmed radiographically.   Given these findings, I went ahead and dislocated the hip, repositioned all   retractors and positioned the right hip in the extended and abducted position.  The final 5 Hi Actis stem was   chosen and it was impacted down to the level of neck cut.  Based on this   and the trial reductions, a final 36+5 Articuleze metal head ball was chosen and   impacted onto a clean and dry trunnion, and the hip was reduced.  The   hip had been irrigated throughout the case again at this point.  I did   reapproximate the superior capsular leaflet to the anterior leaflet   using #1 Vicryl.  The fascia of the   tensor fascia lata muscle was then reapproximated using #1 Vicryl and #0 Stratafix sutures.  The    remaining wound was closed with 2-0 Vicryl and running 4-0 Monocryl.   The hip was cleaned, dried, and dressed sterilely using Dermabond and  Aquacel dressing.  The patient was then brought   to recovery room in stable condition tolerating the procedure well.    Rosalene Billings, PA-C was present for the entirety of the case involved from   preoperative positioning, perioperative retractor management, general   facilitation of the case, as well as primary wound closure as assistant.            Madlyn Frankel Charlann Boxer, M.D.        03/14/2023 7:26 AM

## 2023-03-14 NOTE — Interval H&P Note (Signed)
History and Physical Interval Note:  03/14/2023 7:21 AM  Jonathan Mathews  has presented today for surgery, with the diagnosis of right femoral neck fracture.  The various methods of treatment have been discussed with the patient and family. After consideration of risks, benefits and other options for treatment, the patient has consented to  Procedure(s): TOTAL HIP ARTHROPLASTY ANTERIOR APPROACH (Right) as a surgical intervention.  The patient's history has been reviewed, patient examined, no change in status, stable for surgery.  I have reviewed the patient's chart and labs.  Questions were answered to the patient's satisfaction.     [name]

## 2023-03-14 NOTE — Progress Notes (Signed)
Johann Capers, NP, notified about pt had 4 beats run of vtach at 0207. No new order.

## 2023-03-14 NOTE — Anesthesia Procedure Notes (Signed)
Procedure Name: Intubation Date/Time: 03/14/2023 8:11 AM  Performed by: Deri Fuelling, CRNAPre-anesthesia Checklist: Patient identified, Emergency Drugs available, Suction available and Patient being monitored Patient Re-evaluated:Patient Re-evaluated prior to induction Oxygen Delivery Method: Circle system utilized Preoxygenation: Pre-oxygenation with 100% oxygen Induction Type: IV induction Ventilation: Mask ventilation without difficulty Laryngoscope Size: Glidescope and 4 Grade View: Grade I Tube type: Oral Tube size: 7.5 mm Number of attempts: 1 Airway Equipment and Method: Stylet and Oral airway Placement Confirmation: ETT inserted through vocal cords under direct vision, positive ETCO2 and breath sounds checked- equal and bilateral Secured at: 22 cm Tube secured with: Tape Dental Injury: Teeth and Oropharynx as per pre-operative assessment

## 2023-03-14 NOTE — Anesthesia Preprocedure Evaluation (Signed)
Anesthesia Evaluation  Patient identified by MRN, date of birth, ID band Patient awake    Reviewed: Allergy & Precautions, NPO status , Patient's Chart, lab work & pertinent test results, reviewed documented beta blocker date and time   History of Anesthesia Complications Negative for: history of anesthetic complications  Airway Mallampati: III  TM Distance: >3 FB Neck ROM: Full    Dental  (+) Missing, Edentulous Lower,    Pulmonary shortness of breath, pneumonia, unresolved, COPD, former smoker   + rhonchi  + decreased breath sounds      Cardiovascular hypertension, + CAD and +CHF (EF 35-40%, nonischemic)   Rhythm:Regular Rate:Normal - Systolic murmurs    Neuro/Psych neg Seizures    GI/Hepatic ,GERD  ,,(+) neg Cirrhosis        Endo/Other    Renal/GU CRF and ARFRenal disease     Musculoskeletal   Abdominal   Peds  Hematology   Anesthesia Other Findings   Reproductive/Obstetrics                              Anesthesia Physical Anesthesia Plan  ASA: 3  Anesthesia Plan: General   Post-op Pain Management:    Induction: Intravenous  PONV Risk Score and Plan: 2 and Ondansetron and Dexamethasone  Airway Management Planned: Oral ETT  Additional Equipment:   Intra-op Plan:   Post-operative Plan: Possible Post-op intubation/ventilation and Extubation in OR  Informed Consent: I have reviewed the patients History and Physical, chart, labs and discussed the procedure including the risks, benefits and alternatives for the proposed anesthesia with the patient or authorized representative who has indicated his/her understanding and acceptance.     Dental advisory given  Plan Discussed with: CRNA  Anesthesia Plan Comments: (89 year old with systolic CHF (EF35-40%) admitted after GLF with hip fracture. Multifocal PNA noted on CT chest, currently on empiric tx, sat 87% on 5L [place].  Unfortunately unable to offer SAB safely as he is currently on plavix. Discussed potential of postoperative ventilator requirement and ICU admission with the patient who reports that he understands and desires to proceed. )         Anesthesia Quick Evaluation

## 2023-03-14 NOTE — Progress Notes (Signed)
Patient ID: Jonathan Mathews, male   DOB: Jul 11, 1933, 89 y.o.   MRN: [id number]  To OR today for right THR for his right hip fracture Noted to require 5L [place] O2 at this point due to pneumonia  Post op may require higher level of care initially while this is being treated Doing hip replacement will allow for early mobility which will be helpful to manage his pneumonia

## 2023-03-14 NOTE — Progress Notes (Signed)
Initial Nutrition Assessment  DOCUMENTATION CODES:   Not applicable  INTERVENTION:  Continue to encourage good oral intake Ensure Plus High Protein po BID, each supplement provides 350 kcal and 20 grams of protein. Multivitamin with minerals  NUTRITION DIAGNOSIS:   Increased nutrient needs related to hip fracture as evidenced by estimated needs.    GOAL:   Patient will meet greater than or equal to 90% of their needs    MONITOR:   PO intake, Supplement acceptance  REASON FOR ASSESSMENT:   Consult Hip fracture protocol  ASSESSMENT:  89 y.o. M presented to ED from home with complaints of Right hip pain and abrasions to hands,  after slipping on ice and fell to ground onto his right hip, along with bracing self with hands. Admitted with  closed displaced fracture of right femur neck.  PMH: A-fib, HLD, HTN, CAD, GERD.  Review of EMR revealed; Patient independently living prior to fall.  Able to make needs known. Independent feeding ability with no chewing or swallowing concerns noted at this time.  Has good appetite with no changes reported at this time. Weight stable. 1/19- total hip arthroplasty (right hip)  Admit weight: 77.1 kg     Average Meal Intake: 100% intake x 2 recorded meals  Nutritionally Relevant Medications: Scheduled Meds:  carvedilol  6.25 mg Oral BID WC   senna  2 tablet Oral QHS   sodium chloride flush  3-10 mL Intravenous Q12H    Labs Reviewed: CBG ranges from 146-105 mg/dL over the last 24 hours     NUTRITION - FOCUSED PHYSICAL EXAM:  Deferred   Diet Order:   Diet Order             Diet regular Room service appropriate? Yes; Fluid consistency: Thin  Diet effective now                   EDUCATION NEEDS:   Education needs have been addressed  Skin:  Skin Assessment: Skin Integrity Issues: Skin Integrity Issues:: Incisions Incisions: Right hip  Last BM:  PTA  Height:   Ht Readings from Last 1 Encounters:  03/11/23  5\' 8"  (1.727 m)    Weight:   Wt Readings from Last 1 Encounters:  03/11/23 77.1 kg    Ideal Body Weight:     BMI:  Body mass index is 25.85 kg/m.  Estimated Nutritional Needs:   Kcal:  1950-2350 kcal  Protein:  100-125  Fluid:  20ml/kcal    Jamelle Haring RDN, LDN Clinical Dietitian   If unable to reach, please contact "RD Inpatient" secure chat group between 8 am-4 pm daily"

## 2023-03-15 ENCOUNTER — Encounter (HOSPITAL_COMMUNITY): Payer: Self-pay | Admitting: Orthopedic Surgery

## 2023-03-15 DIAGNOSIS — S72001A Fracture of unspecified part of neck of right femur, initial encounter for closed fracture: Secondary | ICD-10-CM | POA: Diagnosis not present

## 2023-03-15 LAB — CBC
HCT: 30.1 % — ABNORMAL LOW (ref 39.0–52.0)
Hemoglobin: 9.6 g/dL — ABNORMAL LOW (ref 13.0–17.0)
MCH: 32.7 pg (ref 26.0–34.0)
MCHC: 31.9 g/dL (ref 30.0–36.0)
MCV: 102.4 fL — ABNORMAL HIGH (ref 80.0–100.0)
Platelets: 198 10*3/uL (ref 150–400)
RBC: 2.94 MIL/uL — ABNORMAL LOW (ref 4.22–5.81)
RDW: 16.4 % — ABNORMAL HIGH (ref 11.5–15.5)
WBC: 12.4 10*3/uL — ABNORMAL HIGH (ref 4.0–10.5)
nRBC: 0 % (ref 0.0–0.2)

## 2023-03-15 LAB — BASIC METABOLIC PANEL
Anion gap: 8 (ref 5–15)
BUN: 35 mg/dL — ABNORMAL HIGH (ref 8–23)
CO2: 22 mmol/L (ref 22–32)
Calcium: 7.7 mg/dL — ABNORMAL LOW (ref 8.9–10.3)
Chloride: 109 mmol/L (ref 98–111)
Creatinine, Ser: 2.15 mg/dL — ABNORMAL HIGH (ref 0.61–1.24)
GFR, Estimated: 29 mL/min — ABNORMAL LOW (ref >60)
Glucose, Bld: 138 mg/dL — ABNORMAL HIGH (ref 70–99)
Potassium: 4 mmol/L (ref 3.5–5.1)
Sodium: 139 mmol/L (ref 135–145)

## 2023-03-15 LAB — MAGNESIUM: Magnesium: 2.2 mg/dL (ref 1.7–2.4)

## 2023-03-15 MED ORDER — SODIUM CHLORIDE 0.9 % IV SOLN
3.0000 g | Freq: Two times a day (BID) | INTRAVENOUS | Status: DC
  Administered 2023-03-15 – 2023-03-17 (×4): 3 g via INTRAVENOUS
  Filled 2023-03-15 (×3): qty 8

## 2023-03-15 NOTE — Progress Notes (Signed)
Patient's granddaughter at bedside and requested RN to request case manager/social worker review and offer SNF's  in [facility] b/c that is where patient lives and his wife wouldn't be able to get to [place] to visit and would like patient in [facility] if it is possible.   Secure chat message sent to Case Manager listed on treatment team Fannie Knee

## 2023-03-15 NOTE — Progress Notes (Signed)
PROGRESS NOTE    Jonathan Mathews  ZOX:[id number] DOB: 1933-03-16 DOA: 03/11/2023 PCP: [facility], DO    Brief Narrative:   89 y.o. male with medical history significant of PAF, HFrEF (EF 30-35%), CAD, HTN, TIA, on pradaxa.  Patient presented with fall and right hip pain and was admitted with closed displaced fracture of the right femoral neck and is also noted to have community-acquired pneumonia along with AKI on CKD 3.  P orthopedic requested patient to be transferred to [facility] long for surgery.  Cardiology team was consulted for clearance. Patient underwent arthroplasty of the right hip on 1/19, postop course was complicated by hypoxic respiratory failure combination of aspiration/fluid overload requiring Lasix.  This further caused acute kidney injury.  Overall his oxygen levels are slowly improving.   Assessment & Plan:  Principal Problem:   Closed displaced fracture of right femoral neck (HCC) Active Problems:   Acute respiratory failure with hypoxia (HCC)   Community acquired pneumonia   AKI (acute kidney injury) (HCC)   Chronic kidney disease, stage 3 (HCC)   Moderate COPD (chronic obstructive pulmonary disease) (HCC)   Panlobular emphysema (HCC)   Paroxysmal atrial fibrillation (HCC)   Syst congestive heart failure with reduced LV function, NYHA class 1 (HCC)   Right hip fracture, closed Status post arthroplasty 1/19. - Patient transferred from [facility] for surgical management per orthopedic recommendations.  Routine preop and postop management per orthopedic including pain control, wound care, DVT prophylaxis, therapy recommendations.  Acute worsening hypoxia - Currently on 7 L oxygen.  Postop developed concerns for fluid overload versus aspiration pneumonia.  Currently on Unasyn/azithromycin.  Received IV Lasix 1/19 -COVID, RSV, flu, respiratory viral panel-negative  At [facility] kidney injury on CKD stage IIIa - Baseline creatinine 1.5.  Creatinine peaked at  2.15 after diuretics. Monitor urine output and Cr for now.   Congestive heart failure with reduced EF Nonischemic cardiomyopathy - Diuretics currently on hold due to AKI.  Cardiology team is following.  Recent cardiac cath showed nonobstructive CAD.  Currently chest pain-free.  Currently on Plavix, statin, Coreg  PVCs/frequent NSVT - On amiodarone.    COPD - Bronchodilators  Had goals of care discussion with patient's wife, family at bedside.  They understand patient is currently in somewhat critical situation.  They there gree patient should be DNR/DNI  DVT prophylaxis:Lovenox  CODE STATUS-DNR/DNI Family Communication: Daughter been updated Status is: Inpatient Remains inpatient appropriate because: Current hospital stay in stepdown unit  Subjective: Doing much better today.  This morning he is mentating well, pain is better controlled.  He is down to 7 L nasal cannula  Examination:  General exam: Appears calm and comfortable, 7 L nasal cannula Respiratory system: Clear to auscultation. Respiratory effort normal. Cardiovascular system: S1 & S2 heard, RRR. No JVD, murmurs, rubs, gallops or clicks. No pedal edema. Gastrointestinal system: Abdomen is nondistended, soft and nontender. No organomegaly or masses felt. Normal bowel sounds heard. Central nervous system: Alert and oriented to name only. No focal neurological deficits. Extremities: Symmetric 4 x 5 power. Limited ROM Skin: No rashes, lesions or ulcers.  Hip dressing noted without any evidence of active bleeding Psychiatry: Judgement and insight appear poor Foley catheter in place               Diet Orders (From admission, onward)     Start     Ordered   03/14/23 1038  Diet regular Room service appropriate? Yes; Fluid consistency: Thin  Diet  effective now       Question Answer Comment  Room service appropriate? Yes   Fluid consistency: Thin      03/14/23 1037            Objective: Vitals:    03/15/23 0800 03/15/23 0820 03/15/23 0900 03/15/23 0955  BP: (!) 109/49  (!) 143/44   Pulse: 65  77   Resp: 11  15   Temp: 98 F (36.7 C)     TempSrc: Oral     SpO2: 94% 92% 93% 93%  Weight:      Height:        Intake/Output Summary (Last 24 hours) at 03/15/2023 1144 Last data filed at 03/15/2023 0800 Gross per 24 hour  Intake 893.57 ml  Output 1270 ml  Net -376.43 ml   Filed Weights   03/11/23 2158  Weight: 77.1 kg    Scheduled Meds:  acetaminophen  1,000 mg Oral Q6H   amiodarone  400 mg Oral Daily   azelastine  1 spray Each Nare BID   carvedilol  6.25 mg Oral BID WC   clopidogrel  75 mg Oral Daily   feeding supplement  237 mL Oral BID BM   fluticasone furoate-vilanterol  1 puff Inhalation Daily   And   umeclidinium bromide  1 puff Inhalation Daily   montelukast  10 mg Oral QHS   multivitamin with minerals  1 tablet Oral Daily   pantoprazole  80 mg Oral Daily   polyethylene glycol  17 g Oral BID   rosuvastatin  10 mg Oral Daily   senna  2 tablet Oral QHS   sodium chloride flush  3-10 mL Intravenous Q12H   tamsulosin  0.4 mg Oral Daily   Continuous Infusions:  ampicillin-sulbactam (UNASYN) IV Stopped (03/15/23 0536)   azithromycin Stopped (03/15/23 0224)    Nutritional status Signs/Symptoms: estimated needs Interventions: Ensure Enlive (each supplement provides 350kcal and 20 grams of protein), MVI Body mass index is 25.85 kg/m.  Data Reviewed:   CBC: Recent Labs  Lab 03/11/23 2148 03/11/23 2202 03/13/23 0346 03/14/23 0311 03/15/23 0232  WBC 11.6*  --  14.2* 14.3* 12.4*  HGB 14.1 15.0 12.6* 11.8* 9.6*  HCT 43.2 44.0 39.9 37.6* 30.1*  MCV 99.1  --  101.5* 102.5* 102.4*  PLT 269  --  238 231 198   Basic Metabolic Panel: Recent Labs  Lab 03/11/23 2148 03/11/23 2202 03/13/23 0346 03/14/23 0311 03/15/23 0232  NA 137 141 135 139 139  K 3.6 3.9 3.9 3.7 4.0  CL 107 106 106 109 109  CO2 22  --  20* 22 22  GLUCOSE 138* 135* 146* 105* 138*  BUN 23  30* 29* 31* 35*  CREATININE 1.88* 2.00* 1.21 1.51* 2.15*  CALCIUM 8.6*  --  8.4* 8.6* 7.7*  MG  --   --  2.4 2.6* 2.2  PHOS  --   --   --  2.4*  --    GFR: Estimated Creatinine Clearance: 23.4 mL/min (A) (by C-G formula based on SCr of 2.15 mg/dL (H)). Liver Function Tests: Recent Labs  Lab 03/11/23 2148  AST 39  ALT 44  ALKPHOS 80  BILITOT 1.5*  PROT 5.8*  ALBUMIN 3.4*   No results for input(s): "LIPASE", "AMYLASE" in the last 168 hours. No results for input(s): "AMMONIA" in the last 168 hours. Coagulation Profile: Recent Labs  Lab 03/11/23 2148  INR 1.1   Cardiac Enzymes: No results for input(s): "CKTOTAL", "CKMB", "CKMBINDEX", "TROPONINI" in  the last 168 hours. BNP (last 3 results) No results for input(s): "PROBNP" in the last 8760 hours. HbA1C: No results for input(s): "HGBA1C" in the last 72 hours. CBG: Recent Labs  Lab 03/14/23 1155  GLUCAP 101*   Lipid Profile: No results for input(s): "CHOL", "HDL", "LDLCALC", "TRIG", "CHOLHDL", "LDLDIRECT" in the last 72 hours. Thyroid Function Tests: No results for input(s): "TSH", "T4TOTAL", "FREET4", "T3FREE", "THYROIDAB" in the last 72 hours. Anemia Panel: No results for input(s): "VITAMINB12", "FOLATE", "FERRITIN", "TIBC", "IRON", "RETICCTPCT" in the last 72 hours. Sepsis Labs: Recent Labs  Lab 03/11/23 2202 03/13/23 0322  PROCALCITON  --  0.11  LATICACIDVEN 1.1  --     Recent Results (from the past 240 hours)  Resp panel by RT-PCR (RSV, Flu A&B, Covid) Urine, Clean Catch     Status: None   Collection Time: 03/12/23  3:45 AM   Specimen: Urine, Clean Catch; Nasal Swab  Result Value Ref Range Status   SARS Coronavirus 2 by RT PCR NEGATIVE NEGATIVE Final   Influenza A by PCR NEGATIVE NEGATIVE Final   Influenza B by PCR NEGATIVE NEGATIVE Final    Comment: (NOTE) The Xpert Xpress SARS-CoV-2/FLU/RSV plus assay is intended as an aid in the diagnosis of influenza from Nasopharyngeal swab specimens and should not  be used as a sole basis for treatment. Nasal washings and aspirates are unacceptable for Xpert Xpress SARS-CoV-2/FLU/RSV testing.  Fact Sheet for Patients: BloggerCourse.com  Fact Sheet for Healthcare Providers: SeriousBroker.it  This test is not yet approved or cleared by the [place] FDA and has been authorized for detection and/or diagnosis of SARS-CoV-2 by FDA under an Emergency Use Authorization (EUA). This EUA will remain in effect (meaning this test can be used) for the duration of the COVID-19 declaration under Section 564(b)(1) of the Act, 21 U.S.C. section 360bbb-3(b)(1), unless the authorization is terminated or revoked.     Resp Syncytial Virus by PCR NEGATIVE NEGATIVE Final    Comment: (NOTE) Fact Sheet for Patients: BloggerCourse.com  Fact Sheet for Healthcare Providers: SeriousBroker.it  This test is not yet approved or cleared by the [place] FDA and has been authorized for detection and/or diagnosis of SARS-CoV-2 by FDA under an Emergency Use Authorization (EUA). This EUA will remain in effect (meaning this test can be used) for the duration of the COVID-19 declaration under Section 564(b)(1) of the Act, 21 U.S.C. section 360bbb-3(b)(1), unless the authorization is terminated or revoked.  Performed at [facility], [address] [place], [place] [address]   Respiratory (~20 pathogens) panel by PCR     Status: None   Collection Time: 03/12/23  6:24 AM   Specimen: Nasopharyngeal Swab; Respiratory  Result Value Ref Range Status   Adenovirus NOT DETECTED NOT DETECTED Final   Coronavirus 229E NOT DETECTED NOT DETECTED Final    Comment: (NOTE) The Coronavirus on the Respiratory Panel, DOES NOT test for the novel  Coronavirus (2019 nCoV)    Coronavirus HKU1 NOT DETECTED NOT DETECTED Final   Coronavirus NL63 NOT DETECTED NOT DETECTED Final    Coronavirus OC43 NOT DETECTED NOT DETECTED Final   Metapneumovirus NOT DETECTED NOT DETECTED Final   Rhinovirus / Enterovirus NOT DETECTED NOT DETECTED Final   Influenza A NOT DETECTED NOT DETECTED Final   Influenza B NOT DETECTED NOT DETECTED Final   Parainfluenza Virus 1 NOT DETECTED NOT DETECTED Final   Parainfluenza Virus 2 NOT DETECTED NOT DETECTED Final   Parainfluenza Virus 3 NOT DETECTED NOT DETECTED Final  Parainfluenza Virus 4 NOT DETECTED NOT DETECTED Final   Respiratory Syncytial Virus NOT DETECTED NOT DETECTED Final   Bordetella pertussis NOT DETECTED NOT DETECTED Final   Bordetella Parapertussis NOT DETECTED NOT DETECTED Final   Chlamydophila pneumoniae NOT DETECTED NOT DETECTED Final   Mycoplasma pneumoniae NOT DETECTED NOT DETECTED Final    Comment: Performed at [facility], [address] [place], [place] [address]  SARS Coronavirus 2 by RT PCR (hospital order, performed in [facility] hospital lab) *cepheid single result test* Nasopharyngeal Swab     Status: None   Collection Time: 03/12/23  6:24 AM   Specimen: Nasopharyngeal Swab; Nasal Swab  Result Value Ref Range Status   SARS Coronavirus 2 by RT PCR NEGATIVE NEGATIVE Final    Comment: Performed at [facility], [address], [place], [place] [address]  Surgical PCR screen     Status: None   Collection Time: 03/13/23  3:22 AM   Specimen: Nasal Mucosa; Nasal Swab  Result Value Ref Range Status   MRSA, PCR NEGATIVE NEGATIVE Final   Staphylococcus aureus NEGATIVE NEGATIVE Final    Comment: (NOTE) The Xpert SA Assay (FDA approved for NASAL specimens in patients 24 years of age and older), is one component of a comprehensive surveillance program. It is not intended to diagnose infection nor to guide or monitor treatment. Performed at [facility], [address], [place], [place] [address]          Radiology Studies: DG Chest Port 1 View Result Date: 03/14/2023 CLINICAL  DATA:  Shortness of breath EXAM: PORTABLE CHEST 1 VIEW COMPARISON:  03/11/2023 FINDINGS: Persistent pulmonary infiltrates which are diffuse, but with some improvement in the right lower lobe and right middle lobe since the study of 3 days ago. No worsening or new finding IMPRESSION: Persistent diffuse pulmonary infiltrates, but with some improvement in the right lower lobe and right middle lobe since the study of 3 days ago. Electronically Signed   By: Paulina Fusi M.D.   On: 03/14/2023 11:04   DG C-Arm 1-60 Min-No Report Result Date: 03/14/2023 CLINICAL DATA:  Fluoroscopy provided for right hip arthroplasty. EXAM: OPERATIVE RIGHT HIP (WITH PELVIS IF PERFORMED) 3 VIEWS TECHNIQUE: Fluoroscopic spot image(s) were submitted for interpretation post-operatively. Dose: 1.1 mGy. COMPARISON:  03/11/2023. FINDINGS: Submitted images show resection of the right femoral head and neck placement the femoral and acetabular prosthetic components. IMPRESSION: Fluoroscopy provided for right hip arthroplasty. Please refer to the procedure report for further details. Electronically Signed   By: Amie Portland M.D.   On: 03/14/2023 10:12   DG HIP UNILAT WITH PELVIS 1V RIGHT Result Date: 03/14/2023 CLINICAL DATA:  Fluoroscopy provided for right hip arthroplasty. EXAM: OPERATIVE RIGHT HIP (WITH PELVIS IF PERFORMED) 3 VIEWS TECHNIQUE: Fluoroscopic spot image(s) were submitted for interpretation post-operatively. Dose: 1.1 mGy. COMPARISON:  03/11/2023. FINDINGS: Submitted images show resection of the right femoral head and neck placement the femoral and acetabular prosthetic components. IMPRESSION: Fluoroscopy provided for right hip arthroplasty. Please refer to the procedure report for further details. Electronically Signed   By: Amie Portland M.D.   On: 03/14/2023 10:12   DG C-Arm 1-60 Min-No Report Result Date: 03/14/2023 CLINICAL DATA:  Fluoroscopy provided for right hip arthroplasty. EXAM: OPERATIVE RIGHT HIP (WITH PELVIS IF  PERFORMED) 3 VIEWS TECHNIQUE: Fluoroscopic spot image(s) were submitted for interpretation post-operatively. Dose: 1.1 mGy. COMPARISON:  03/11/2023. FINDINGS: Submitted images show resection of the right femoral head and neck placement the  femoral and acetabular prosthetic components. IMPRESSION: Fluoroscopy provided for right hip arthroplasty. Please refer to the procedure report for further details. Electronically Signed   By: Amie Portland M.D.   On: 03/14/2023 10:12   X-ray pelvis complete Result Date: 03/14/2023 CLINICAL DATA:  Postop right hip arthroplasty. EXAM: PELVIS - 1-2 VIEW COMPARISON:  03/11/2023. FINDINGS: New right hip total arthroplasty appears well seated and well aligned. No acute fracture or evidence of an operative complication. SI joints, pubic symphysis and left hip joint are normally spaced and aligned. Right sided soft tissue edema and air from the recent surgery. IMPRESSION: Well-positioned right hip total arthroplasty. Electronically Signed   By: Amie Portland M.D.   On: 03/14/2023 10:10           LOS: 3 days   Time spent= 35 mins    Miguel Rota, MD [facility]  If 7PM-7AM, please contact night-coverage  03/15/2023, 11:44 AM

## 2023-03-15 NOTE — Progress Notes (Signed)
Patient ID: Jonathan Mathews, male   DOB: 1933/07/20, 89 y.o.   MRN: [id number] Subjective: 1 Day Post-Op Procedure(s) (LRB): TOTAL HIP ARTHROPLASTY ANTERIOR APPROACH (Right)    Patient reports pain as mild however no activity as if now Resting comfortably in step down 10L of  O2  Objective:   VITALS:   Vitals:   03/15/23 0600 03/15/23 0700  BP: 136/71 105/66  Pulse: 81 72  Resp: (!) 29 15  Temp:    SpO2: 92% (!) 86%    Neurovascular intact Incision: dressing C/D/I - right hip  LABS Recent Labs    03/13/23 0346 03/14/23 0311 03/15/23 0232  HGB 12.6* 11.8* 9.6*  HCT 39.9 37.6* 30.1*  WBC 14.2* 14.3* 12.4*  PLT 238 231 198    Recent Labs    03/13/23 0346 03/14/23 0311 03/15/23 0232  NA 135 139 139  K 3.9 3.7 4.0  BUN 29* 31* 35*  CREATININE 1.21 1.51* 2.15*  GLUCOSE 146* 105* 138*    No results for input(s): "LABPT", "INR" in the last 72 hours.   Assessment/Plan: 1 Day Post-Op Procedure(s) (LRB): TOTAL HIP ARTHROPLASTY ANTERIOR APPROACH (Right)   Advance diet Up with therapy as appropriate for condition, WBAT Mobilization would be helpful Resume Plavix, discontinued aspirin order, for prior history and DVT prophylaxis

## 2023-03-15 NOTE — Progress Notes (Signed)
PT Cancellation Note  Patient Details Name: Jonathan Mathews MRN: [id number] DOB: [birth year]   Cancelled Treatment:    Reason Eval/Treat Not Completed: Other (comment) Patient  to transfer to another room, will evaluate after transfer. [name] PT Acute Rehabilitation Services Office [phone] Weekend pager-[phone]   [name] 03/15/2023, 9:55 AM

## 2023-03-15 NOTE — Progress Notes (Signed)
Inpatient Rehab Admissions Coordinator Note:   Per therapy recommendations patient was screened for CIR candidacy by Stephania Fragmin, PT. At this time, pt appears to be a potential candidate for CIR. I will place an order for rehab consult for full assessment, per our protocol.  Please contact me any with questions.Estill Dooms, PT, DPT [phone] 03/15/23 4:57 PM

## 2023-03-15 NOTE — Evaluation (Signed)
Occupational Therapy Evaluation Patient Details Name: Jonathan Mathews MRN: [id number] DOB: [birth year] Today's Date: 03/15/2023   History of Present Illness Patient is a 89 y.o. male who was admitted 03/11/23 after fall on ice. patient sustained closed displaced  right femoral neck fracture. S/P THA  anterior approach on 03/14/23. patient also noted with CAP,AKI and CKD.PMH: PAF, HFrEF (EF 30-35%), CAD, HTN, TIA.   Clinical Impression   Patient is a 89 year old male who was admitted for above. Patient was living at home with wife with independence in ADLs PLOF. Currently, patient is mod A for bed mobility and transfers with increased pain with movement. Patient noted to have hearing deficits with family in room answering questions for patient impacting cog assessment would benefit from one in next session if family is not present. Patient was noted to have decreased functional activity tolerance, decreased endurance, decreased standing balance, decreased safety awareness, and decreased knowledge of AD/AE impacting participation in ADLs. Patient will benefit from intensive inpatient follow up therapy, >3 hours/day.        If plan is discharge home, recommend the following: A lot of help with bathing/dressing/bathroom;Assistance with cooking/housework;Direct supervision/assist for medications management;Assist for transportation;A lot of help with walking and/or transfers;Help with stairs or ramp for entrance;Direct supervision/assist for financial management    Functional Status Assessment  Patient has had a recent decline in their functional status and demonstrates the ability to make significant improvements in function in a reasonable and predictable amount of time.  Equipment Recommendations  None recommended by OT       Precautions / Restrictions Precautions Precautions: Fall Precaution Comments: monitor sats, on 6 L currently Restrictions Weight Bearing Restrictions Per Provider Order:  Yes RLE Weight Bearing Per Provider Order: Weight bearing as tolerated      Mobility Bed Mobility Overal bed mobility: Needs Assistance Bed Mobility: Sidelying to Sit   Sidelying to sit: Mod assist, HOB elevated       General bed mobility comments: reached for bed rail to turn, not able to lie on side, Assisted legs and trunk to sitting upright and scoot tobed edge          Balance Overall balance assessment: Needs assistance Sitting-balance support: Feet supported, Bilateral upper extremity supported Sitting balance-Leahy Scale: Fair     Standing balance support: Bilateral upper extremity supported, Reliant on assistive device for balance, During functional activity Standing balance-Leahy Scale: Poor         ADL either performed or assessed with clinical judgement   ADL Overall ADL's : Needs assistance/impaired Eating/Feeding: Set up;Sitting   Grooming: Oral care;Wash/dry hands;Sitting;Set up Grooming Details (indicate cue type and reason): in recliner Upper Body Bathing: Supervision/ safety;Sitting   Lower Body Bathing: Total assistance;Sitting/lateral leans   Upper Body Dressing : Contact guard assist;Sitting   Lower Body Dressing: Sitting/lateral leans;Total assistance   Toilet Transfer: Minimal assistance;+2 for physical assistance;+2 for safety/equipment;Ambulation;Rolling walker (2 wheels) Toilet Transfer Details (indicate cue type and reason): with increased time to take steps in room. patient noted to drop O2 with activity. Toileting- Architect and Hygiene: Sit to/from stand;Total assistance                Pertinent Vitals/Pain Pain Assessment Pain Assessment: Faces Faces Pain Scale: Hurts little more Pain Location: right hip/thigh Pain Descriptors / Indicators: Grimacing, Guarding Pain Intervention(s): Limited activity within patient's tolerance, Monitored during session, Ice applied     Extremity/Trunk Assessment Upper  Extremity Assessment Upper Extremity Assessment: Overall [facility]  for tasks assessed   Lower Extremity Assessment Lower Extremity Assessment: Defer to PT evaluation RLE Deficits / Details: antalgic to WB on the right LE.  required asssit to move leg over bed edge   Cervical / Trunk Assessment Cervical / Trunk Assessment: Kyphotic   Communication Communication Communication: Hearing impairment Cueing Techniques: Verbal cues;Gestural cues;Tactile cues;Visual cues   Cognition Arousal: Alert Behavior During Therapy: Impulsive Overall Cognitive Status: Difficult to assess       General Comments: family answering a  lot of quetions for patient unable to get cog assessment completed with daughter, son in law, wife and sister in law present in room all answering for patient.                Home Living Family/patient expects to be discharged to:: Private residence Living Arrangements: Spouse/significant other Available Help at Discharge: Family;Available 24 hours/day Type of Home: House Home Access: Stairs to enter     Home Layout: One level               Home Equipment: None          Prior Functioning/Environment Prior Level of Function : Independent/Modified Independent;Driving                        OT Problem List: Decreased strength;Impaired balance (sitting and/or standing);Pain;Decreased activity tolerance;Decreased range of motion;Decreased safety awareness;Decreased knowledge of precautions;Cardiopulmonary status limiting activity      OT Treatment/Interventions: Self-care/ADL training;DME and/or AE instruction;Therapeutic activities;Balance training;Energy conservation;Patient/family education    OT Goals(Current goals can be found in the care plan section) Acute Rehab OT Goals Patient Stated Goal: to get better OT Goal Formulation: With patient/family Time For Goal Achievement: 03/29/23 Potential to Achieve Goals: Fair  OT Frequency: Min 1X/week     Co-evaluation   Reason for Co-Treatment: To address functional/ADL transfers PT goals addressed during session: Mobility/safety with mobility OT goals addressed during session: ADL's and self-care      AM-PAC OT "6 Clicks" Daily Activity     Outcome Measure Help from another person eating meals?: A Little Help from another person taking care of personal grooming?: A Little Help from another person toileting, which includes using toliet, bedpan, or urinal?: A Lot Help from another person bathing (including washing, rinsing, drying)?: A Lot Help from another person to put on and taking off regular upper body clothing?: A Little Help from another person to put on and taking off regular lower body clothing?: A Lot 6 Click Score: 15   End of Session Equipment Utilized During Treatment: Gait belt;Rolling walker (2 wheels) Nurse Communication: Mobility status;Other (comment) (L hand palmar surface bleeding under bandage that was already in place after using RW)  Activity Tolerance: Patient tolerated treatment well Patient left: in chair;with call bell/phone within reach;with family/visitor present  OT Visit Diagnosis: Other abnormalities of gait and mobility (R26.89);Unsteadiness on feet (R26.81);Pain;Muscle weakness (generalized) (M62.81);History of falling (Z91.81)                Time: 5732-2025 OT Time Calculation (min): 32 min Charges:  OT General Charges $OT Visit: 1 Visit OT Evaluation $OT Eval Moderate Complexity: 1 Mod  Jonathan Mathews OTR/L, MS Acute Rehabilitation Department Office# [phone]   Selinda Flavin 03/15/2023, 4:38 PM

## 2023-03-15 NOTE — Evaluation (Signed)
Physical Therapy Evaluation Patient Details Name: Jonathan Mathews MRN: [id number] DOB: Nov 14, 1933 Today's Date: 03/15/2023  History of Present Illness  89 y.o. maleadmitted 03/11/23 after fall on ice, sustained closed displaced  right femoral neck fracture, S/P THA  anterior approach on 03/14/23., also noted with CAP, AKI along with CKD.PMH: PAF, HFrEF (EF 30-35%), CAD, HTN, TIA, on pradaxa.  Clinical Impression  Pt admitted with above diagnosis.  Pt currently with functional limitations due to the deficits listed below (see PT Problem List). Pt will benefit from acute skilled PT to increase their independence and safety with mobility to allow discharge.    The patient required mod support for  bed mobility and ambulating  x 12' with RW. Patient's family present. Patient is independent and driving  PTA. Patient required frequent cues  for mobility and safety while ambulating with RW, is noted with hearing  loss.  Patient will benefit from intensive inpatient follow up therapy, >3 hours/day.  Patient on 6 LPM , 95% SPO2, which dropped to 75% on RA ,(not on O2 PTA). Replaced  on 6 LPM for short distance ambulation, SPO2 93%.         If plan is discharge home, recommend the following: A little help with walking and/or transfers;Assistance with cooking/housework;Assist for transportation;Help with stairs or ramp for entrance;A little help with bathing/dressing/bathroom   Can travel by private vehicle   No    Equipment Recommendations Rolling walker (2 wheels)  Recommendations for Other Services  Rehab consult    Functional Status Assessment Patient has had a recent decline in their functional status and demonstrates the ability to make significant improvements in function in a reasonable and predictable amount of time.     Precautions / Restrictions Precautions Precautions: Fall Precaution Comments: monitor sats, on 6 L currently Restrictions Weight Bearing Restrictions Per Provider Order:  Yes RLE Weight Bearing Per Provider Order: Weight bearing as tolerated      Mobility  Bed Mobility Overal bed mobility: Needs Assistance Bed Mobility: Sidelying to Sit   Sidelying to sit: Mod assist, HOB elevated       General bed mobility comments: reached for bed rail to turn, not able to lie on side, Assisted legs and trunk to sitting upright and scoot tobed edge    Transfers Overall transfer level: Needs assistance Equipment used: Rolling walker (2 wheels) Transfers: Sit to/from Stand Sit to Stand: Mod assist           General transfer comment: steady assist for balance  , cues to back up to recliner and get close prior to sitting down , tactile cues to reach back for armrests    Ambulation/Gait Ambulation/Gait assistance: Mod assist Gait Distance (Feet): 12 Feet Assistive device: Rolling walker (2 wheels) Gait Pattern/deviations: Step-to pattern, Staggering right, Staggering left, Antalgic, Decreased stance time - right, Decreased step length - right Gait velocity: decr     General Gait Details: cues to stay inside RW  multiple times. Antalgic on RLE  Stairs            Wheelchair Mobility     Tilt Bed    Modified Rankin (Stroke Patients Only)       Balance Overall balance assessment: Needs assistance Sitting-balance support: Feet supported, Bilateral upper extremity supported Sitting balance-Leahy Scale: Fair     Standing balance support: Bilateral upper extremity supported, Reliant on assistive device for balance, During functional activity Standing balance-Leahy Scale: Poor  Pertinent Vitals/Pain Pain Assessment Pain Assessment: Faces Faces Pain Scale: Hurts little more Pain Location: right hip/thigh Pain Intervention(s): Monitored during session, Repositioned, Ice applied    Home Living Family/patient expects to be discharged to:: Private residence Living Arrangements: Spouse/significant  other Available Help at Discharge: Family;Available 24 hours/day Type of Home: House Home Access: Stairs to enter       Home Layout: One level Home Equipment: None      Prior Function Prior Level of Function : Independent/Modified Independent;Driving                     Extremity/Trunk Assessment        Lower Extremity Assessment Lower Extremity Assessment: RLE deficits/detail RLE Deficits / Details: antalgic to WB on the right LE.  required asssit to move leg over bed edge    Cervical / Trunk Assessment Cervical / Trunk Assessment: Kyphotic  Communication   Communication Communication: Hearing impairment Cueing Techniques: Verbal cues;Gestural cues;Tactile cues;Visual cues  Cognition Arousal: Alert Behavior During Therapy: Impulsive Overall Cognitive Status: Difficult to assess                                 General Comments: family answering a  lot of quetions for patient, follows directions when heard        General Comments      Exercises     Assessment/Plan    PT Assessment Patient needs continued PT services  PT Problem List Decreased strength;Decreased balance;Decreased knowledge of precautions;Decreased range of motion;Decreased mobility;Decreased activity tolerance;Pain       PT Treatment Interventions Gait training;DME instruction;Therapeutic activities;Therapeutic exercise;Patient/family education;Functional mobility training    PT Goals (Current goals can be found in the Care Plan section)  Acute Rehab PT Goals Patient Stated Goal: to go to rehab PT Goal Formulation: With patient/family Time For Goal Achievement: 03/29/23 Potential to Achieve Goals: Good    Frequency Min 1X/week     Co-evaluation PT/OT/SLP Co-Evaluation/Treatment: Yes Reason for Co-Treatment: To address functional/ADL transfers PT goals addressed during session: Mobility/safety with mobility OT goals addressed during session: ADL's and self-care        AM-PAC PT "6 Clicks" Mobility  Outcome Measure Help needed turning from your back to your side while in a flat bed without using bedrails?: A Lot Help needed moving from lying on your back to sitting on the side of a flat bed without using bedrails?: A Lot Help needed moving to and from a bed to a chair (including a wheelchair)?: A Lot Help needed standing up from a chair using your arms (e.g., wheelchair or bedside chair)?: A Lot Help needed to walk in hospital room?: A Lot Help needed climbing 3-5 steps with a railing? : Total 6 Click Score: 11    End of Session Equipment Utilized During Treatment: Gait belt Activity Tolerance: Patient tolerated treatment well Patient left: in chair;with chair alarm set;with call bell/phone within reach;with family/visitor present Nurse Communication: Mobility status PT Visit Diagnosis: Unsteadiness on feet (R26.81);Muscle weakness (generalized) (M62.81);Difficulty in walking, not elsewhere classified (R26.2);Pain Pain - Right/Left: Right Pain - part of body: Hip    Time: 1420-1445 PT Time Calculation (min) (ACUTE ONLY): 25 min   Charges:   PT Evaluation $PT Eval Low Complexity: 1 Low   PT General Charges $$ ACUTE PT VISIT: 1 Visit         [name] PT Acute Rehabilitation Services Office [phone] Weekend  pager-[phone]   [name] 03/15/2023, 3:50 PM

## 2023-03-15 NOTE — Progress Notes (Signed)
PHARMACY NOTE:  ANTIMICROBIAL RENAL DOSAGE ADJUSTMENT  Current antimicrobial regimen includes a mismatch between antimicrobial dosage and estimated renal function.  As per policy approved by the Pharmacy & Therapeutics and Medical Executive Committees, the antimicrobial dosage will be adjusted accordingly.  Current antimicrobial dosage:  Unasyn 3g q6hrs  Indication: aspiration PNA  Renal Function:  Estimated Creatinine Clearance: 23.4 mL/min (A) (by C-G formula based on SCr of 2.15 mg/dL (H)). []      On intermittent HD, scheduled: []      On CRRT    Antimicrobial dosage has been changed to:  Unasyn 3g q12hrs  Additional comments: N/A   Thank you for allowing pharmacy to be a part of this patient's care.  Cherylin Mylar, PharmD Clinical Pharmacist  1/20/202511:49 AM

## 2023-03-16 ENCOUNTER — Inpatient Hospital Stay (HOSPITAL_COMMUNITY): Payer: Medicare Other

## 2023-03-16 DIAGNOSIS — S72001A Fracture of unspecified part of neck of right femur, initial encounter for closed fracture: Secondary | ICD-10-CM | POA: Diagnosis not present

## 2023-03-16 LAB — CBC
HCT: 32.3 % — ABNORMAL LOW (ref 39.0–52.0)
Hemoglobin: 10.5 g/dL — ABNORMAL LOW (ref 13.0–17.0)
MCH: 32.3 pg (ref 26.0–34.0)
MCHC: 32.5 g/dL (ref 30.0–36.0)
MCV: 99.4 fL (ref 80.0–100.0)
Platelets: 248 10*3/uL (ref 150–400)
RBC: 3.25 MIL/uL — ABNORMAL LOW (ref 4.22–5.81)
RDW: 16.2 % — ABNORMAL HIGH (ref 11.5–15.5)
WBC: 17.5 10*3/uL — ABNORMAL HIGH (ref 4.0–10.5)
nRBC: 0 % (ref 0.0–0.2)

## 2023-03-16 LAB — BASIC METABOLIC PANEL
Anion gap: 8 (ref 5–15)
BUN: 37 mg/dL — ABNORMAL HIGH (ref 8–23)
CO2: 23 mmol/L (ref 22–32)
Calcium: 8.7 mg/dL — ABNORMAL LOW (ref 8.9–10.3)
Chloride: 108 mmol/L (ref 98–111)
Creatinine, Ser: 1.73 mg/dL — ABNORMAL HIGH (ref 0.61–1.24)
GFR, Estimated: 38 mL/min — ABNORMAL LOW (ref >60)
Glucose, Bld: 130 mg/dL — ABNORMAL HIGH (ref 70–99)
Potassium: 4 mmol/L (ref 3.5–5.1)
Sodium: 139 mmol/L (ref 135–145)

## 2023-03-16 LAB — MAGNESIUM: Magnesium: 2.6 mg/dL — ABNORMAL HIGH (ref 1.7–2.4)

## 2023-03-16 LAB — PHOSPHORUS: Phosphorus: 2.5 mg/dL (ref 2.5–4.6)

## 2023-03-16 MED ORDER — FUROSEMIDE 10 MG/ML IJ SOLN
20.0000 mg | Freq: Once | INTRAMUSCULAR | Status: AC
  Administered 2023-03-16: 20 mg via INTRAVENOUS
  Filled 2023-03-16: qty 2

## 2023-03-16 MED ORDER — IPRATROPIUM-ALBUTEROL 0.5-2.5 (3) MG/3ML IN SOLN
3.0000 mL | Freq: Two times a day (BID) | RESPIRATORY_TRACT | Status: DC
  Administered 2023-03-16 – 2023-03-18 (×4): 3 mL via RESPIRATORY_TRACT
  Filled 2023-03-16 (×5): qty 3

## 2023-03-16 NOTE — Progress Notes (Signed)
Physical Therapy Treatment Patient Details Name: Jonathan Mathews MRN: [id number] DOB: 07-25-1933 Today's Date: 03/16/2023   History of Present Illness Patient is a 89 y.o. male who was admitted 03/11/23 after fall on ice. patient sustained closed displaced  right femoral neck fracture. S/P THA  anterior approach on 03/14/23. patient also noted with CAP,AKI and CKD.PMH: PAF, HFrEF (EF 30-35%), CAD, HTN, TIA.    PT Comments  The patient is seated in recliner, sliding forward. Max Assist  to sit upright. Patient reporting significant right leg pain, Patient had  pain medication ~ 1hr prior. Patient required 2 mod assistance to stand from recliner. Patient unable  to tolerate WB on right LE to progress ambulation. Attempted step in place, patient requesting to stop and sit down. Repositioned in recliner and placed Ice. RN in and will provide muscle relaxant. Patient will benefit from intensive inpatient follow up therapy, >3 hours/day Patient resting on 4 L 88% SPO2, Dropped to 85% while sitting upright, increased to 6 L  with Spo2 88% for standing. Placed back on 4 L after settled in recliner. Spo2 90%  Encouraged IS and flutter.   If plan is discharge home, recommend the following: A little help with walking and/or transfers;Assistance with cooking/housework;Assist for transportation;Help with stairs or ramp for entrance;A little help with bathing/dressing/bathroom   Can travel by private vehicle     No  Equipment Recommendations  Rolling walker (2 wheels)    Recommendations for Other Services Rehab consult     Precautions / Restrictions Precautions Precautions: Fall Precaution Comments: monitor sats, on 4 L currently Restrictions RLE Weight Bearing Per Provider Order: Weight bearing as tolerated     Mobility  Bed Mobility               General bed mobility comments: in recliner, sliding out. assisted to slide back into upright position.    Transfers Overall transfer level:  Needs assistance Equipment used: Rolling walker (2 wheels) Transfers: Sit to/from Stand Sit to Stand: Mod assist, +2 safety/equipment, +2 physical assistance           General transfer comment: Patient reporting pain Left abdoment and both groin areas. Assisted to stand at [facility] with mod support, Patient  attempted to step, antalgic, unable  to progress.    Ambulation/Gait               General Gait Details: unable to progress due to reports of Right hip and thigh and right side pain.   Stairs             Wheelchair Mobility     Tilt Bed    Modified Rankin (Stroke Patients Only)       Balance Overall balance assessment: Needs assistance Sitting-balance support: Feet supported, Bilateral upper extremity supported Sitting balance-Leahy Scale: Fair     Standing balance support: Bilateral upper extremity supported, Reliant on assistive device for balance, During functional activity Standing balance-Leahy Scale: Poor Standing balance comment: decreased WB on the right                            Cognition Arousal: Alert Behavior During Therapy: Restless Overall Cognitive Status: Difficult to assess                                 General Comments: patient moaning and reporting L side painand Left leg, falt affect.  Exercises      General Comments        Pertinent Vitals/Pain Pain Assessment Pain Assessment: Faces Faces Pain Scale: Hurts whole lot Pain Location: right hip/thigh Pain Descriptors / Indicators: Grimacing, Guarding, Moaning Pain Intervention(s): Monitored during session, Limited activity within patient's tolerance, Patient requesting pain meds-RN notified, Ice applied, Repositioned    Home Living                          Prior Function            PT Goals (current goals can now be found in the care plan section) Progress towards PT goals: Progressing toward goals    Frequency    Min  1X/week      PT Plan      Co-evaluation              AM-PAC PT "6 Clicks" Mobility   Outcome Measure  Help needed turning from your back to your side while in a flat bed without using bedrails?: A Lot Help needed moving from lying on your back to sitting on the side of a flat bed without using bedrails?: A Lot Help needed moving to and from a bed to a chair (including a wheelchair)?: A Lot Help needed standing up from a chair using your arms (e.g., wheelchair or bedside chair)?: A Lot Help needed to walk in hospital room?: Total Help needed climbing 3-5 steps with a railing? : Total 6 Click Score: 10    End of Session Equipment Utilized During Treatment: Gait belt Activity Tolerance: Patient limited by pain Patient left: in chair;with chair alarm set;with call bell/phone within reach Nurse Communication: Mobility status, need fro more pain medication, and SPO2 dropping. PT Visit Diagnosis: Unsteadiness on feet (R26.81);Muscle weakness (generalized) (M62.81);Difficulty in walking, not elsewhere classified (R26.2);Pain Pain - Right/Left: Right Pain - part of body: Hip     Time: 1308-6578 PT Time Calculation (min) (ACUTE ONLY): 26 min  Charges:    $Therapeutic Activity: 23-37 mins                       [name] PT Acute Rehabilitation Services Office [phone] Weekend pager-[phone]    [name] 03/16/2023, 12:37 PM

## 2023-03-16 NOTE — Progress Notes (Signed)
  Inpatient Rehabilitation Admissions Coordinator    I contacted patient's wife as well as daughter by phone. Wife prefers rehab in [place] and [place] [facility] or [place] [facility]. I will notify acute team and TOC. We will sign off.  Ottie Glazier, RN, MSN Rehab Admissions Coordinator [phone] 03/16/2023 5:47 PM  Wife called this am to state that [facility] is her first choice for rehab.  Ottie Glazier, RN, MSN Rehab Admissions Coordinator [phone] 03/17/2023 8:58 AM

## 2023-03-16 NOTE — Progress Notes (Signed)
PROGRESS NOTE    Jonathan Mathews  OZH:[id number] DOB: 1933/06/06 DOA: 03/11/2023 PCP: Toy Care, DO    Brief Narrative:   89 y.o. male with medical history significant of PAF, HFrEF (EF 30-35%), CAD, HTN, TIA, on pradaxa.  Patient presented with fall and right hip pain and was admitted with closed displaced fracture of the right femoral neck and is also noted to have community-acquired pneumonia along with AKI on CKD 3.  P orthopedic requested patient to be transferred to [facility] long for surgery.  Cardiology team was consulted for clearance. Patient underwent arthroplasty of the right hip on 1/19, postop course was complicated by hypoxic respiratory failure combination of aspiration/fluid overload requiring Lasix.  This further caused acute kidney injury.  Overall his oxygen levels are slowly improving.   Assessment & Plan:  Principal Problem:   Closed displaced fracture of right femoral neck (HCC) Active Problems:   Acute respiratory failure with hypoxia (HCC)   Community acquired pneumonia   AKI (acute kidney injury) (HCC)   Chronic kidney disease, stage 3 (HCC)   Moderate COPD (chronic obstructive pulmonary disease) (HCC)   Panlobular emphysema (HCC)   Paroxysmal atrial fibrillation (HCC)   Syst congestive heart failure with reduced LV function, NYHA class 1 (HCC)   Right hip fracture, closed Status post arthroplasty 1/19. - Patient transferred from [facility] for surgical management per orthopedic recommendations.  Routine preop and postop management per orthopedic including pain control, wound care, DVT prophylaxis, therapy recommendations. PT potentially considering CIR  Acute worsening hypoxia - Still on 6 L nasal cannula.  Concerns of fluid overload versus aspiration postoperatively.  Today is the last day of azithromycin, complete 5 days of Unasyn (EOT 1/23).  Chest x-ray improved but still on 6 L nasal cannula.  Will give additional dose of Lasix -COVID, RSV,  flu, respiratory viral panel-negative  At [facility] kidney injury on CKD stage IIIa - Baseline creatinine 1.5.  Creatinine peaked 2.15.  Improving  Congestive heart failure with reduced EF Nonischemic cardiomyopathy - Diuretics currently on hold due to AKI.  Cardiology team is following.  Recent cardiac cath showed nonobstructive CAD.  Currently chest pain-free.  Currently on Plavix, statin, Coreg  PVCs/frequent NSVT - On amiodarone.    COPD - Bronchodilators  After extensive discussion with patient's wife, daughter and rest of the family.  Patient is DNR/DNI  DVT prophylaxis:Lovenox  CODE STATUS-DNR/DNI Family Communication: Family at bedside Status is: Inpatient Remains inpatient appropriate because: Current hospital stay in stepdown unit  Subjective: Sitting up in the recliner, having significant amount of right hip pain Tells me he is not worried about breathing as much as his hip pain  Examination:  General exam: Appears calm and comfortable, 6 L nasal cannula Respiratory system: Clear to auscultation. Respiratory effort normal. Cardiovascular system: S1 & S2 heard, RRR. No JVD, murmurs, rubs, gallops or clicks. No pedal edema. Gastrointestinal system: Abdomen is nondistended, soft and nontender. No organomegaly or masses felt. Normal bowel sounds heard. Central nervous system: Alert and oriented to name only. No focal neurological deficits. Extremities: Symmetric 4 x 5 power. Limited ROM Skin: No rashes, lesions or ulcers.  Hip dressing noted without any evidence of active bleeding Psychiatry: Judgement and insight appear poor Foley catheter in place               Diet Orders (From admission, onward)     Start     Ordered   03/14/23 1038  Diet regular Room service  appropriate? Yes; Fluid consistency: Thin  Diet effective now       Question Answer Comment  Room service appropriate? Yes   Fluid consistency: Thin      03/14/23 1037             Objective: Vitals:   03/16/23 0011 03/16/23 0455 03/16/23 0833 03/16/23 0923  BP: 139/78 130/68  115/65  Pulse: 82 75  71  Resp: 19 18  16   Temp: (!) 97.3 F (36.3 C) 97.8 F (36.6 C)  98.1 F (36.7 C)  TempSrc: Oral Oral  Oral  SpO2: 91% 95% 98% (!) 86%  Weight:      Height:        Intake/Output Summary (Last 24 hours) at 03/16/2023 1244 Last data filed at 03/16/2023 0527 Gross per 24 hour  Intake 388.74 ml  Output 1000 ml  Net -611.26 ml   Filed Weights   03/11/23 2158  Weight: 77.1 kg    Scheduled Meds:  acetaminophen  1,000 mg Oral Q6H   amiodarone  400 mg Oral Daily   azelastine  1 spray Each Nare BID   carvedilol  6.25 mg Oral BID WC   clopidogrel  75 mg Oral Daily   feeding supplement  237 mL Oral BID BM   fluticasone furoate-vilanterol  1 puff Inhalation Daily   And   umeclidinium bromide  1 puff Inhalation Daily   furosemide  20 mg Intravenous Once   montelukast  10 mg Oral QHS   multivitamin with minerals  1 tablet Oral Daily   pantoprazole  80 mg Oral Daily   polyethylene glycol  17 g Oral BID   rosuvastatin  10 mg Oral Daily   senna  2 tablet Oral QHS   sodium chloride flush  3-10 mL Intravenous Q12H   tamsulosin  0.4 mg Oral Daily   Continuous Infusions:  ampicillin-sulbactam (UNASYN) IV 3 g (03/16/23 0431)   azithromycin 500 mg (03/15/23 2334)    Nutritional status Signs/Symptoms: estimated needs Interventions: Ensure Enlive (each supplement provides 350kcal and 20 grams of protein), MVI Body mass index is 25.85 kg/m.  Data Reviewed:   CBC: Recent Labs  Lab 03/11/23 2148 03/11/23 2202 03/13/23 0346 03/14/23 0311 03/15/23 0232 03/16/23 0419  WBC 11.6*  --  14.2* 14.3* 12.4* 17.5*  HGB 14.1 15.0 12.6* 11.8* 9.6* 10.5*  HCT 43.2 44.0 39.9 37.6* 30.1* 32.3*  MCV 99.1  --  101.5* 102.5* 102.4* 99.4  PLT 269  --  238 231 198 248   Basic Metabolic Panel: Recent Labs  Lab 03/11/23 2148 03/11/23 2202 03/13/23 0346  03/14/23 0311 03/15/23 0232 03/16/23 0419  NA 137 141 135 139 139 139  K 3.6 3.9 3.9 3.7 4.0 4.0  CL 107 106 106 109 109 108  CO2 22  --  20* 22 22 23   GLUCOSE 138* 135* 146* 105* 138* 130*  BUN 23 30* 29* 31* 35* 37*  CREATININE 1.88* 2.00* 1.21 1.51* 2.15* 1.73*  CALCIUM 8.6*  --  8.4* 8.6* 7.7* 8.7*  MG  --   --  2.4 2.6* 2.2 2.6*  PHOS  --   --   --  2.4*  --  2.5   GFR: Estimated Creatinine Clearance: 29.1 mL/min (A) (by C-G formula based on SCr of 1.73 mg/dL (H)). Liver Function Tests: Recent Labs  Lab 03/11/23 2148  AST 39  ALT 44  ALKPHOS 80  BILITOT 1.5*  PROT 5.8*  ALBUMIN 3.4*   No results for  input(s): "LIPASE", "AMYLASE" in the last 168 hours. No results for input(s): "AMMONIA" in the last 168 hours. Coagulation Profile: Recent Labs  Lab 03/11/23 2148  INR 1.1   Cardiac Enzymes: No results for input(s): "CKTOTAL", "CKMB", "CKMBINDEX", "TROPONINI" in the last 168 hours. BNP (last 3 results) No results for input(s): "PROBNP" in the last 8760 hours. HbA1C: No results for input(s): "HGBA1C" in the last 72 hours. CBG: Recent Labs  Lab 03/14/23 1155  GLUCAP 101*   Lipid Profile: No results for input(s): "CHOL", "HDL", "LDLCALC", "TRIG", "CHOLHDL", "LDLDIRECT" in the last 72 hours. Thyroid Function Tests: No results for input(s): "TSH", "T4TOTAL", "FREET4", "T3FREE", "THYROIDAB" in the last 72 hours. Anemia Panel: No results for input(s): "VITAMINB12", "FOLATE", "FERRITIN", "TIBC", "IRON", "RETICCTPCT" in the last 72 hours. Sepsis Labs: Recent Labs  Lab 03/11/23 2202 03/13/23 0322  PROCALCITON  --  0.11  LATICACIDVEN 1.1  --     Recent Results (from the past 240 hours)  Resp panel by RT-PCR (RSV, Flu A&B, Covid) Urine, Clean Catch     Status: None   Collection Time: 03/12/23  3:45 AM   Specimen: Urine, Clean Catch; Nasal Swab  Result Value Ref Range Status   SARS Coronavirus 2 by RT PCR NEGATIVE NEGATIVE Final   Influenza A by PCR NEGATIVE  NEGATIVE Final   Influenza B by PCR NEGATIVE NEGATIVE Final    Comment: (NOTE) The Xpert Xpress SARS-CoV-2/FLU/RSV plus assay is intended as an aid in the diagnosis of influenza from Nasopharyngeal swab specimens and should not be used as a sole basis for treatment. Nasal washings and aspirates are unacceptable for Xpert Xpress SARS-CoV-2/FLU/RSV testing.  Fact Sheet for Patients: BloggerCourse.com  Fact Sheet for Healthcare Providers: SeriousBroker.it  This test is not yet approved or cleared by the [place] FDA and has been authorized for detection and/or diagnosis of SARS-CoV-2 by FDA under an Emergency Use Authorization (EUA). This EUA will remain in effect (meaning this test can be used) for the duration of the COVID-19 declaration under Section 564(b)(1) of the Act, 21 U.S.C. section 360bbb-3(b)(1), unless the authorization is terminated or revoked.     Resp Syncytial Virus by PCR NEGATIVE NEGATIVE Final    Comment: (NOTE) Fact Sheet for Patients: BloggerCourse.com  Fact Sheet for Healthcare Providers: SeriousBroker.it  This test is not yet approved or cleared by the [place] FDA and has been authorized for detection and/or diagnosis of SARS-CoV-2 by FDA under an Emergency Use Authorization (EUA). This EUA will remain in effect (meaning this test can be used) for the duration of the COVID-19 declaration under Section 564(b)(1) of the Act, 21 U.S.C. section 360bbb-3(b)(1), unless the authorization is terminated or revoked.  Performed at [facility], [address], [place], [place] [address]   Respiratory (~20 pathogens) panel by PCR     Status: None   Collection Time: 03/12/23  6:24 AM   Specimen: Nasopharyngeal Swab; Respiratory  Result Value Ref Range Status   Adenovirus NOT DETECTED NOT DETECTED Final   Coronavirus 229E NOT DETECTED NOT DETECTED  Final    Comment: (NOTE) The Coronavirus on the Respiratory Panel, DOES NOT test for the novel  Coronavirus (2019 nCoV)    Coronavirus HKU1 NOT DETECTED NOT DETECTED Final   Coronavirus NL63 NOT DETECTED NOT DETECTED Final   Coronavirus OC43 NOT DETECTED NOT DETECTED Final   Metapneumovirus NOT DETECTED NOT DETECTED Final   Rhinovirus / Enterovirus NOT DETECTED NOT DETECTED Final   Influenza A NOT DETECTED  NOT DETECTED Final   Influenza B NOT DETECTED NOT DETECTED Final   Parainfluenza Virus 1 NOT DETECTED NOT DETECTED Final   Parainfluenza Virus 2 NOT DETECTED NOT DETECTED Final   Parainfluenza Virus 3 NOT DETECTED NOT DETECTED Final   Parainfluenza Virus 4 NOT DETECTED NOT DETECTED Final   Respiratory Syncytial Virus NOT DETECTED NOT DETECTED Final   Bordetella pertussis NOT DETECTED NOT DETECTED Final   Bordetella Parapertussis NOT DETECTED NOT DETECTED Final   Chlamydophila pneumoniae NOT DETECTED NOT DETECTED Final   Mycoplasma pneumoniae NOT DETECTED NOT DETECTED Final    Comment: Performed at [facility], [address], [place], [place] [address]  SARS Coronavirus 2 by RT PCR (hospital order, performed in [facility] hospital lab) *cepheid single result test* Nasopharyngeal Swab     Status: None   Collection Time: 03/12/23  6:24 AM   Specimen: Nasopharyngeal Swab; Nasal Swab  Result Value Ref Range Status   SARS Coronavirus 2 by RT PCR NEGATIVE NEGATIVE Final    Comment: Performed at [facility], [address], [place], [place] [address]  Surgical PCR screen     Status: None   Collection Time: 03/13/23  3:22 AM   Specimen: Nasal Mucosa; Nasal Swab  Result Value Ref Range Status   MRSA, PCR NEGATIVE NEGATIVE Final   Staphylococcus aureus NEGATIVE NEGATIVE Final    Comment: (NOTE) The Xpert SA Assay (FDA approved for NASAL specimens in patients 83 years of age and older), is one component of a comprehensive surveillance program. It is not intended to diagnose  infection nor to guide or monitor treatment. Performed at [facility], [address], [place], [place] [address]          Radiology Studies: [facility] Chest [place] 1 View Result Date: 03/16/2023 CLINICAL DATA:  89 year old male with shortness of breath. EXAM: PORTABLE CHEST 1 VIEW COMPARISON:  Portable chest 03/14/2023 and earlier. FINDINGS: Portable AP semi upright view at 0817 hours. Regressed but not resolved bilateral pulmonary interstitial opacity with residual now most pronounced in the right upper lobe. Improved lung volumes. Stable cardiac size and mediastinal contours. Visualized tracheal air column is within normal limits. No pneumothorax, pleural effusion, or areas of worsening ventilation. Stable visualized osseous structures.  Paucity of visible bowel gas. IMPRESSION: 1. Regressed bilateral pulmonary interstitial opacity with residual most pronounced in the right upper lobe. 2. Improved lung volumes and ventilation. No new cardiopulmonary abnormality. Electronically Signed   By: Odessa Fleming M.D.   On: 03/16/2023 09:09           LOS: 4 days   Time spent= 35 mins    Miguel Rota, MD [facility]  If 7PM-7AM, please contact night-coverage  03/16/2023, 12:44 PM

## 2023-03-16 NOTE — TOC Initial Note (Signed)
Transition of Care [facility]) - Initial/Assessment Note    Patient Details  Name: Jonathan Mathews MRN: [id number] Date of Birth: 1933-12-29  Transition of Care [facility]) CM/SW Contact:    Larrie Kass, LCSW Phone Number: 03/16/2023, 11:57 AM  Clinical Narrative:                 Noted pt rec for AIR. CIR is reviewing for possible placement. TOC to follow for d/c needs.   Expected Discharge Plan: IP Rehab Facility Barriers to Discharge: Continued Medical Work up   Patient Goals and CMS Choice            Expected Discharge Plan and Services                                              Prior Living Arrangements/Services                       Activities of Daily Living   ADL Screening (condition at time of admission) Independently performs ADLs?: Yes (appropriate for developmental age) Is the patient deaf or have difficulty hearing?: No Does the patient have difficulty seeing, even when wearing glasses/contacts?: No Does the patient have difficulty concentrating, remembering, or making decisions?: No  Permission Sought/Granted                  Emotional Assessment              Admission diagnosis:  Acute respiratory failure with hypoxia (HCC) [J96.01] AKI (acute kidney injury) (HCC) [N17.9] Closed fracture of neck of right femur, initial encounter (HCC) [S72.001A] Fall, initial encounter [W19.XXXA] Closed displaced fracture of right femoral neck (HCC) [S72.001A] Patient Active Problem List   Diagnosis Date Noted   AKI (acute kidney injury) (HCC) 03/12/2023   Closed displaced fracture of right femoral neck (HCC) 03/12/2023   Chronic kidney disease, stage 3 (HCC) 09/24/2022   Syst congestive heart failure with reduced LV function, NYHA class 1 (HCC) 08/30/2020   Moderate COPD (chronic obstructive pulmonary disease) (HCC) 07/16/2020   Panlobular emphysema (HCC) 08/05/2017   Acute respiratory failure with hypoxia (HCC) 05/05/2015    Community acquired pneumonia 05/05/2015   Benign essential hypertension 03/04/2015   Paroxysmal atrial fibrillation (HCC) 03/04/2015   PCP:  Toy Care, DO Pharmacy:   [facility] [phone] - [place], [place] - [address] [place] [address] Phone: [phone] Fax: [phone]     Social Drivers of Health (SDOH) Social History: SDOH Screenings   Food Insecurity: No Food Insecurity (03/12/2023)  Housing: Low Risk  (03/12/2023)  Transportation Needs: No Transportation Needs (03/12/2023)  Utilities: Not At Risk (03/12/2023)  Social Connections: Socially Integrated (03/12/2023)  Tobacco Use: Medium Risk (03/11/2023)   SDOH Interventions:     Readmission Risk Interventions     No data to display

## 2023-03-16 NOTE — Plan of Care (Signed)
  Problem: Activity: Goal: Risk for activity intolerance will decrease Outcome: Progressing   Problem: Elimination: Goal: Will not experience complications related to urinary retention Outcome: Progressing   

## 2023-03-17 ENCOUNTER — Inpatient Hospital Stay (HOSPITAL_COMMUNITY): Payer: Medicare Other

## 2023-03-17 LAB — BLOOD GAS, ARTERIAL
Acid-Base Excess: 0.5 mmol/L (ref 0.0–2.0)
Bicarbonate: 24.6 mmol/L (ref 20.0–28.0)
Drawn by: 30136
FIO2: 12 %
O2 Saturation: 98 %
Patient temperature: 36.7
pCO2 arterial: 36 mm[Hg] (ref 32–48)
pH, Arterial: 7.43 (ref 7.35–7.45)
pO2, Arterial: 74 mm[Hg] — ABNORMAL LOW (ref 83–108)

## 2023-03-17 LAB — BASIC METABOLIC PANEL
Anion gap: 11 (ref 5–15)
BUN: 37 mg/dL — ABNORMAL HIGH (ref 8–23)
CO2: 20 mmol/L — ABNORMAL LOW (ref 22–32)
Calcium: 8.4 mg/dL — ABNORMAL LOW (ref 8.9–10.3)
Chloride: 109 mmol/L (ref 98–111)
Creatinine, Ser: 1.61 mg/dL — ABNORMAL HIGH (ref 0.61–1.24)
GFR, Estimated: 41 mL/min — ABNORMAL LOW (ref >60)
Glucose, Bld: 100 mg/dL — ABNORMAL HIGH (ref 70–99)
Potassium: 3.8 mmol/L (ref 3.5–5.1)
Sodium: 140 mmol/L (ref 135–145)

## 2023-03-17 LAB — BRAIN NATRIURETIC PEPTIDE: B Natriuretic Peptide: 629.2 pg/mL — ABNORMAL HIGH (ref 0.0–100.0)

## 2023-03-17 LAB — MAGNESIUM: Magnesium: 2.5 mg/dL — ABNORMAL HIGH (ref 1.7–2.4)

## 2023-03-17 MED ORDER — FUROSEMIDE 10 MG/ML IJ SOLN
40.0000 mg | Freq: Two times a day (BID) | INTRAMUSCULAR | Status: DC
  Administered 2023-03-17 – 2023-03-18 (×3): 40 mg via INTRAVENOUS
  Filled 2023-03-17 (×3): qty 4

## 2023-03-17 MED ORDER — ALPRAZOLAM 0.5 MG PO TABS
0.5000 mg | ORAL_TABLET | Freq: Once | ORAL | Status: AC
  Administered 2023-03-17: 0.5 mg via ORAL

## 2023-03-17 MED ORDER — FUROSEMIDE 10 MG/ML IJ SOLN
20.0000 mg | Freq: Once | INTRAMUSCULAR | Status: AC
  Administered 2023-03-17: 20 mg via INTRAVENOUS

## 2023-03-17 MED ORDER — FUROSEMIDE 10 MG/ML IJ SOLN
INTRAMUSCULAR | Status: AC
  Filled 2023-03-17: qty 2

## 2023-03-17 MED ORDER — SODIUM CHLORIDE 0.9 % IV SOLN
3.0000 g | Freq: Four times a day (QID) | INTRAVENOUS | Status: DC
  Administered 2023-03-17 – 2023-03-18 (×4): 3 g via INTRAVENOUS
  Filled 2023-03-17 (×4): qty 8

## 2023-03-17 NOTE — Progress Notes (Signed)
Called to patients room due to desaturation episode.  Patient was on 4lpm.  Pts saturation dropped in the low 80's oxygen was increased.  Patient couldn't get above 88%.  Pt given neb TX as well as placed on  high flow salter at 12 lpm.  Patients SP02 now 95%.  RT will continue to monitor pt and wean oxygen as tolerated.

## 2023-03-17 NOTE — Plan of Care (Signed)
  Problem: Pain Managment: Goal: General experience of comfort will improve and/or be controlled Outcome: Progressing   Problem: Safety: Goal: Ability to remain free from injury will improve Outcome: Progressing   Problem: Skin Integrity: Goal: Risk for impaired skin integrity will decrease Outcome: Progressing

## 2023-03-17 NOTE — Progress Notes (Signed)
Pharmacy Antibiotic Note  Jonathan Mathews is a 89 y.o. male admitted on 03/11/2023 with  aspiration PNA .  Pharmacy has been consulted for ampicillin/sulbactam dosing.  Today, 03/17/23 Renal function improving, CrCl now 31 mL/min  Day  7 of IV antibiotics.   Plan: Increase ampicillin/sulbactam to 3 g IV q6h Continue to monitor renal function  Height: 5\' 8"  (172.7 cm) Weight: 77.1 kg (170 lb) IBW/kg (Calculated) : 68.4  Temp (24hrs), Avg:98 F (36.7 C), Min:97.4 F (36.3 C), Max:98.9 F (37.2 C)  Recent Labs  Lab 03/11/23 2148 03/11/23 2202 03/13/23 0346 03/14/23 0311 03/15/23 0232 03/16/23 0419 03/17/23 0415  WBC 11.6*  --  14.2* 14.3* 12.4* 17.5*  --   CREATININE 1.88* 2.00* 1.21 1.51* 2.15* 1.73* 1.61*  LATICACIDVEN  --  1.1  --   --   --   --   --     Estimated Creatinine Clearance: 31.3 mL/min (A) (by C-G formula based on SCr of 1.61 mg/dL (H)).    Allergies  Allergen Reactions   Clindamycin/Lincomycin Diarrhea   Doxycycline Nausea And Vomiting    Gi upset    Pravastatin     Urinary retention   Zocor [Simvastatin]     myalgia   Ivp Dye [Iodinated Contrast Media] Rash    Cindi Carbon, PharmD 03/17/2023 12:10 PM

## 2023-03-17 NOTE — Progress Notes (Signed)
PROGRESS NOTE    Jonathan Mathews  WGN:[id number] DOB: September 06, 1933 DOA: 03/11/2023 PCP: [facility], DO    Brief Narrative:   89 y.o. male with medical history significant of PAF, HFrEF (EF 30-35%), CAD, HTN, TIA, on pradaxa.  Patient presented with fall and right hip pain and was admitted with closed displaced fracture of the right femoral neck and is also noted to have community-acquired pneumonia along with AKI on CKD 3.  P orthopedic requested patient to be transferred to [facility] for surgery.  Cardiology team was consulted for clearance. Patient underwent arthroplasty of the right hip on 1/19, postop course was complicated by hypoxic respiratory failure combination of aspiration/fluid overload requiring Lasix.  This further caused acute kidney injury.  Overall his oxygen levels are slowly improving.   Assessment & Plan:  Principal Problem:   Closed displaced fracture of right femoral neck (HCC) Active Problems:   Acute respiratory failure with hypoxia (HCC)   Community acquired pneumonia   AKI (acute kidney injury) (HCC)   Chronic kidney disease, stage 3 (HCC)   Moderate COPD (chronic obstructive pulmonary disease) (HCC)   Panlobular emphysema (HCC)   Paroxysmal atrial fibrillation (HCC)   Syst congestive heart failure with reduced LV function, NYHA class 1 (HCC)   Right hip fracture, closed Status post arthroplasty 1/19. - Patient transferred from [facility] for surgical management per orthopedic recommendations.  Routine preop and postop management per orthopedic including pain control, wound care, DVT prophylaxis, therapy recommendations. PT potentially considering CIR  Acute worsening hypoxia - Concerns about fluid overload and aspiration pneumonia.  Initially improved but now back again on 12 L high flow.  BNP elevated.  Chest x-ray showing vascular congestion/opacity/infiltrate.  Continue Unasyn and azithromycin.  Will start Lasix twice daily, closely monitor  renal function -COVID, RSV, flu, respiratory viral panel-negative  At [facility] kidney injury on CKD stage IIIa - Baseline creatinine 1.5.  Creatinine peaked 2.15.  Slowly started improving but a.m. labs are pending  Congestive heart failure with reduced EF Nonischemic cardiomyopathy - Diuretics currently on hold due to AKI.  Cardiology team is following.  Recent cardiac cath showed nonobstructive CAD.  Currently chest pain-free.  Currently on Plavix, statin, Coreg  PVCs/frequent NSVT - On amiodarone.    COPD - Bronchodilators  After extensive discussion with patient's wife, daughter and rest of the family.  Patient is DNR/DNI  DVT prophylaxis:Lovenox  CODE STATUS-DNR/DNI Family Communication: Kim updated. Status is: Inpatient Remains inpatient appropriate because: Current hospital stay in stepdown unit  Subjective: Still having exertional dyspnea  Examination:  General exam: Appears calm and comfortable, 12 L nasal cannula Respiratory system: diminished BS at the bases.  Cardiovascular system: S1 & S2 heard, RRR. No JVD, murmurs, rubs, gallops or clicks. No pedal edema. Gastrointestinal system: Abdomen is nondistended, soft and nontender. No organomegaly or masses felt. Normal bowel sounds heard. Central nervous system: Alert and oriented to name only. No focal neurological deficits. Extremities: Symmetric 4 x 5 power. Limited ROM Skin: No rashes, lesions or ulcers.  Hip dressing noted without any evidence of active bleeding Psychiatry: Judgement and insight appear poor Foley catheter in place               Diet Orders (From admission, onward)     Start     Ordered   03/14/23 1038  Diet regular Room service appropriate? Yes; Fluid consistency: Thin  Diet effective now       Question Answer Comment  Room  service appropriate? Yes   Fluid consistency: Thin      03/14/23 1037            Objective: Vitals:   03/17/23 0325 03/17/23 0519 03/17/23 0814 03/17/23  0929  BP: 132/74 131/64 (!) 111/59   Pulse: 81 75    Resp: 18     Temp: 97.9 F (36.6 C) 98.9 F (37.2 C)    TempSrc: Oral     SpO2: 92% 90%  93%  Weight:      Height:        Intake/Output Summary (Last 24 hours) at 03/17/2023 1111 Last data filed at 03/17/2023 0340 Gross per 24 hour  Intake --  Output 300 ml  Net -300 ml   Filed Weights   03/11/23 2158  Weight: 77.1 kg    Scheduled Meds:  acetaminophen  1,000 mg Oral Q6H   amiodarone  400 mg Oral Daily   azelastine  1 spray Each Nare BID   carvedilol  6.25 mg Oral BID WC   clopidogrel  75 mg Oral Daily   feeding supplement  237 mL Oral BID BM   fluticasone furoate-vilanterol  1 puff Inhalation Daily   And   umeclidinium bromide  1 puff Inhalation Daily   furosemide  40 mg Intravenous BID   ipratropium-albuterol  3 mL Nebulization BID   montelukast  10 mg Oral QHS   multivitamin with minerals  1 tablet Oral Daily   pantoprazole  80 mg Oral Daily   polyethylene glycol  17 g Oral BID   rosuvastatin  10 mg Oral Daily   senna  2 tablet Oral QHS   sodium chloride flush  3-10 mL Intravenous Q12H   tamsulosin  0.4 mg Oral Daily   Continuous Infusions:  ampicillin-sulbactam (UNASYN) IV 3 g (03/17/23 0529)    Nutritional status Signs/Symptoms: estimated needs Interventions: Ensure Enlive (each supplement provides 350kcal and 20 grams of protein), MVI Body mass index is 25.85 kg/m.  Data Reviewed:   CBC: Recent Labs  Lab 03/11/23 2148 03/11/23 2202 03/13/23 0346 03/14/23 0311 03/15/23 0232 03/16/23 0419  WBC 11.6*  --  14.2* 14.3* 12.4* 17.5*  HGB 14.1 15.0 12.6* 11.8* 9.6* 10.5*  HCT 43.2 44.0 39.9 37.6* 30.1* 32.3*  MCV 99.1  --  101.5* 102.5* 102.4* 99.4  PLT 269  --  238 231 198 248   Basic Metabolic Panel: Recent Labs  Lab 03/13/23 0346 03/14/23 0311 03/15/23 0232 03/16/23 0419 03/17/23 0415  NA 135 139 139 139 140  K 3.9 3.7 4.0 4.0 3.8  CL 106 109 109 108 109  CO2 20* 22 22 23  20*   GLUCOSE 146* 105* 138* 130* 100*  BUN 29* 31* 35* 37* 37*  CREATININE 1.21 1.51* 2.15* 1.73* 1.61*  CALCIUM 8.4* 8.6* 7.7* 8.7* 8.4*  MG 2.4 2.6* 2.2 2.6* 2.5*  PHOS  --  2.4*  --  2.5  --    GFR: Estimated Creatinine Clearance: 31.3 mL/min (A) (by C-G formula based on SCr of 1.61 mg/dL (H)). Liver Function Tests: Recent Labs  Lab 03/11/23 2148  AST 39  ALT 44  ALKPHOS 80  BILITOT 1.5*  PROT 5.8*  ALBUMIN 3.4*   No results for input(s): "LIPASE", "AMYLASE" in the last 168 hours. No results for input(s): "AMMONIA" in the last 168 hours. Coagulation Profile: Recent Labs  Lab 03/11/23 2148  INR 1.1   Cardiac Enzymes: No results for input(s): "CKTOTAL", "CKMB", "CKMBINDEX", "TROPONINI" in the last 168 hours.  BNP (last 3 results) No results for input(s): "PROBNP" in the last 8760 hours. HbA1C: No results for input(s): "HGBA1C" in the last 72 hours. CBG: Recent Labs  Lab 03/14/23 1155  GLUCAP 101*   Lipid Profile: No results for input(s): "CHOL", "HDL", "LDLCALC", "TRIG", "CHOLHDL", "LDLDIRECT" in the last 72 hours. Thyroid Function Tests: No results for input(s): "TSH", "T4TOTAL", "FREET4", "T3FREE", "THYROIDAB" in the last 72 hours. Anemia Panel: No results for input(s): "VITAMINB12", "FOLATE", "FERRITIN", "TIBC", "IRON", "RETICCTPCT" in the last 72 hours. Sepsis Labs: Recent Labs  Lab 03/11/23 2202 03/13/23 0322  PROCALCITON  --  0.11  LATICACIDVEN 1.1  --     Recent Results (from the past 240 hours)  Resp panel by RT-PCR (RSV, Flu A&B, Covid) Urine, Clean Catch     Status: None   Collection Time: 03/12/23  3:45 AM   Specimen: Urine, Clean Catch; Nasal Swab  Result Value Ref Range Status   SARS Coronavirus 2 by RT PCR NEGATIVE NEGATIVE Final   Influenza A by PCR NEGATIVE NEGATIVE Final   Influenza B by PCR NEGATIVE NEGATIVE Final    Comment: (NOTE) The Xpert Xpress SARS-CoV-2/FLU/RSV plus assay is intended as an aid in the diagnosis of influenza from  Nasopharyngeal swab specimens and should not be used as a sole basis for treatment. Nasal washings and aspirates are unacceptable for Xpert Xpress SARS-CoV-2/FLU/RSV testing.  Fact Sheet for Patients: BloggerCourse.com  Fact Sheet for Healthcare Providers: SeriousBroker.it  This test is not yet approved or cleared by the [place] FDA and has been authorized for detection and/or diagnosis of SARS-CoV-2 by FDA under an Emergency Use Authorization (EUA). This EUA will remain in effect (meaning this test can be used) for the duration of the COVID-19 declaration under Section 564(b)(1) of the Act, 21 U.S.C. section 360bbb-3(b)(1), unless the authorization is terminated or revoked.     Resp Syncytial Virus by PCR NEGATIVE NEGATIVE Final    Comment: (NOTE) Fact Sheet for Patients: BloggerCourse.com  Fact Sheet for Healthcare Providers: SeriousBroker.it  This test is not yet approved or cleared by the [place] FDA and has been authorized for detection and/or diagnosis of SARS-CoV-2 by FDA under an Emergency Use Authorization (EUA). This EUA will remain in effect (meaning this test can be used) for the duration of the COVID-19 declaration under Section 564(b)(1) of the Act, 21 U.S.C. section 360bbb-3(b)(1), unless the authorization is terminated or revoked.  Performed at [facility], [address], [place], [place] [address]   Respiratory (~20 pathogens) panel by PCR     Status: None   Collection Time: 03/12/23  6:24 AM   Specimen: Nasopharyngeal Swab; Respiratory  Result Value Ref Range Status   Adenovirus NOT DETECTED NOT DETECTED Final   Coronavirus 229E NOT DETECTED NOT DETECTED Final    Comment: (NOTE) The Coronavirus on the Respiratory Panel, DOES NOT test for the novel  Coronavirus (2019 nCoV)    Coronavirus HKU1 NOT DETECTED NOT DETECTED Final    Coronavirus NL63 NOT DETECTED NOT DETECTED Final   Coronavirus OC43 NOT DETECTED NOT DETECTED Final   Metapneumovirus NOT DETECTED NOT DETECTED Final   Rhinovirus / Enterovirus NOT DETECTED NOT DETECTED Final   Influenza A NOT DETECTED NOT DETECTED Final   Influenza B NOT DETECTED NOT DETECTED Final   Parainfluenza Virus 1 NOT DETECTED NOT DETECTED Final   Parainfluenza Virus 2 NOT DETECTED NOT DETECTED Final   Parainfluenza Virus 3 NOT DETECTED NOT DETECTED Final   Parainfluenza Virus  4 NOT DETECTED NOT DETECTED Final   Respiratory Syncytial Virus NOT DETECTED NOT DETECTED Final   Bordetella pertussis NOT DETECTED NOT DETECTED Final   Bordetella Parapertussis NOT DETECTED NOT DETECTED Final   Chlamydophila pneumoniae NOT DETECTED NOT DETECTED Final   Mycoplasma pneumoniae NOT DETECTED NOT DETECTED Final    Comment: Performed at [facility], [address], [place], [place] [address]  SARS Coronavirus 2 by RT PCR (hospital order, performed in [facility] hospital lab) *cepheid single result test* Nasopharyngeal Swab     Status: None   Collection Time: 03/12/23  6:24 AM   Specimen: Nasopharyngeal Swab; Nasal Swab  Result Value Ref Range Status   SARS Coronavirus 2 by RT PCR NEGATIVE NEGATIVE Final    Comment: Performed at [facility], [address] [place], [place] [address]  Surgical PCR screen     Status: None   Collection Time: 03/13/23  3:22 AM   Specimen: Nasal Mucosa; Nasal Swab  Result Value Ref Range Status   MRSA, PCR NEGATIVE NEGATIVE Final   Staphylococcus aureus NEGATIVE NEGATIVE Final    Comment: (NOTE) The Xpert SA Assay (FDA approved for NASAL specimens in patients 7 years of age and older), is one component of a comprehensive surveillance program. It is not intended to diagnose infection nor to guide or monitor treatment. Performed at [facility], [address], [place], [place] [address]          Radiology Studies: DG  CHEST PORT 1 VIEW Result Date: 03/17/2023 CLINICAL DATA:  89 year old male with increase shortness of breath. EXAM: PORTABLE CHEST 1 VIEW COMPARISON:  Portable chest yesterday. FINDINGS: Portable AP semi upright view at 0546 hours. Lower lung volumes with increasing coarse and confluent bilateral lung opacity. Upper lobe primarily affected on the right. Multilobar involvement on the left. Generalized progression since yesterday. Grossly stable mediastinal contours. Visualized tracheal air column is within normal limits. No pneumothorax. No obvious pleural effusion. Paucity of bowel gas. Stable visualized osseous structures. IMPRESSION: Lower lung volumes and progressed bilateral pulmonary coarse and interstitial opacity in the same distribution as on recent exams. Unclear if this is progressive infection or recurrent asymmetric pulmonary edema. Electronically Signed   By: Odessa Fleming M.D.   On: 03/17/2023 06:01   DG Chest Port 1 View Result Date: 03/16/2023 CLINICAL DATA:  89 year old male with shortness of breath. EXAM: PORTABLE CHEST 1 VIEW COMPARISON:  Portable chest 03/14/2023 and earlier. FINDINGS: Portable AP semi upright view at 0817 hours. Regressed but not resolved bilateral pulmonary interstitial opacity with residual now most pronounced in the right upper lobe. Improved lung volumes. Stable cardiac size and mediastinal contours. Visualized tracheal air column is within normal limits. No pneumothorax, pleural effusion, or areas of worsening ventilation. Stable visualized osseous structures.  Paucity of visible bowel gas. IMPRESSION: 1. Regressed bilateral pulmonary interstitial opacity with residual most pronounced in the right upper lobe. 2. Improved lung volumes and ventilation. No new cardiopulmonary abnormality. Electronically Signed   By: Odessa Fleming M.D.   On: 03/16/2023 09:09           LOS: 5 days   Time spent= 35 mins    Miguel Rota, MD [facility]  If 7PM-7AM, please contact  night-coverage  03/17/2023, 11:11 AM

## 2023-03-17 NOTE — TOC Progression Note (Signed)
Transition of Care [facility]) - Progression Note    Patient Details  Name: Jonathan Mathews MRN: [id number] Date of Birth: [birth year]  Transition of Care [facility]) CM/SW Contact  Larrie Kass, LCSW Phone Number: 03/17/2023, 9:55 AM  Clinical Narrative:    CSW spoke with the [name]. She reports wanting the pt to be placed at [facility] for rehab. CSW sent a referral to Ann at [facility] for review for AIR. TOC to follow   Expected Discharge Plan: IP Rehab Facility Barriers to Discharge: Continued Medical Work up  Expected Discharge Plan and Services                                               Social Determinants of Health (SDOH) Interventions SDOH Screenings   Food Insecurity: No Food Insecurity (03/12/2023)  Housing: Low Risk  (03/12/2023)  Transportation Needs: No Transportation Needs (03/12/2023)  Utilities: Not At Risk (03/12/2023)  Social Connections: Socially Integrated (03/12/2023)  Tobacco Use: Medium Risk (03/11/2023)    Readmission Risk Interventions     No data to display

## 2023-03-18 ENCOUNTER — Other Ambulatory Visit (HOSPITAL_COMMUNITY): Payer: Medicare Other

## 2023-03-18 LAB — BASIC METABOLIC PANEL
Anion gap: 9 (ref 5–15)
BUN: 37 mg/dL — ABNORMAL HIGH (ref 8–23)
CO2: 26 mmol/L (ref 22–32)
Calcium: 8.4 mg/dL — ABNORMAL LOW (ref 8.9–10.3)
Chloride: 109 mmol/L (ref 98–111)
Creatinine, Ser: 1.8 mg/dL — ABNORMAL HIGH (ref 0.61–1.24)
GFR, Estimated: 36 mL/min — ABNORMAL LOW (ref >60)
Glucose, Bld: 112 mg/dL — ABNORMAL HIGH (ref 70–99)
Potassium: 3.7 mmol/L (ref 3.5–5.1)
Sodium: 144 mmol/L (ref 135–145)

## 2023-03-18 LAB — PROCALCITONIN: Procalcitonin: 0.18 ng/mL

## 2023-03-18 LAB — TROPONIN I (HIGH SENSITIVITY)
Troponin I (High Sensitivity): 37 ng/L — ABNORMAL HIGH (ref <18)
Troponin I (High Sensitivity): 40 ng/L — ABNORMAL HIGH (ref <18)

## 2023-03-18 LAB — BRAIN NATRIURETIC PEPTIDE: B Natriuretic Peptide: 617.1 pg/mL — ABNORMAL HIGH (ref 0.0–100.0)

## 2023-03-18 LAB — MAGNESIUM: Magnesium: 2.4 mg/dL (ref 1.7–2.4)

## 2023-03-18 MED ORDER — ALPRAZOLAM 0.5 MG PO TABS
0.5000 mg | ORAL_TABLET | Freq: Three times a day (TID) | ORAL | Status: DC | PRN
  Administered 2023-03-18 – 2023-03-20 (×2): 0.5 mg via ORAL
  Filled 2023-03-18 (×2): qty 1

## 2023-03-18 MED ORDER — FUROSEMIDE 10 MG/ML IJ SOLN
20.0000 mg | Freq: Once | INTRAMUSCULAR | Status: AC
  Administered 2023-03-18: 20 mg via INTRAVENOUS
  Filled 2023-03-18: qty 2

## 2023-03-18 MED ORDER — METHYLPREDNISOLONE SODIUM SUCC 40 MG IJ SOLR
40.0000 mg | Freq: Two times a day (BID) | INTRAMUSCULAR | Status: DC
  Administered 2023-03-18 – 2023-03-20 (×5): 40 mg via INTRAVENOUS
  Filled 2023-03-18 (×5): qty 1

## 2023-03-18 MED ORDER — SODIUM CHLORIDE 0.9 % IV SOLN
3.0000 g | Freq: Two times a day (BID) | INTRAVENOUS | Status: DC
  Administered 2023-03-18 – 2023-03-20 (×4): 3 g via INTRAVENOUS
  Filled 2023-03-18 (×4): qty 8

## 2023-03-18 MED ORDER — ALBUTEROL SULFATE (2.5 MG/3ML) 0.083% IN NEBU
2.5000 mg | INHALATION_SOLUTION | Freq: Two times a day (BID) | RESPIRATORY_TRACT | Status: DC
  Administered 2023-03-18 – 2023-03-20 (×3): 2.5 mg via RESPIRATORY_TRACT
  Filled 2023-03-18 (×4): qty 3

## 2023-03-18 MED ORDER — FUROSEMIDE 10 MG/ML IJ SOLN
60.0000 mg | Freq: Two times a day (BID) | INTRAMUSCULAR | Status: DC
  Administered 2023-03-18 – 2023-03-19 (×2): 60 mg via INTRAVENOUS
  Filled 2023-03-18 (×2): qty 6

## 2023-03-18 NOTE — Progress Notes (Signed)
Pharmacy Antibiotic Note  Jonathan Mathews is a 89 y.o. male admitted on 03/11/2023 with  aspiration PNA .  Pharmacy has been consulted for ampicillin/sulbactam dosing.  Today, 03/18/23 SCr bumped slightly, CrCl now <30 mL/min Worsening respiratory status requiring HFNC @ 15 L/min  Day #8 of IV antibiotics.   Plan: Decrease ampicillin/sulbactam to 3 g IV q12h Continue to monitor renal function  Height: 5\' 8"  (172.7 cm) Weight: 77.1 kg (170 lb) IBW/kg (Calculated) : 68.4  Temp (24hrs), Avg:98.2 F (36.8 C), Min:97.9 F (36.6 C), Max:98.5 F (36.9 C)  Recent Labs  Lab 03/11/23 2148 03/11/23 2202 03/13/23 0346 03/14/23 0311 03/15/23 0232 03/16/23 0419 03/17/23 0415 03/18/23 0403  WBC 11.6*  --  14.2* 14.3* 12.4* 17.5*  --   --   CREATININE 1.88* 2.00* 1.21 1.51* 2.15* 1.73* 1.61* 1.80*  LATICACIDVEN  --  1.1  --   --   --   --   --   --     Estimated Creatinine Clearance: 28 mL/min (A) (by C-G formula based on SCr of 1.8 mg/dL (H)).    Allergies  Allergen Reactions   Clindamycin/Lincomycin Diarrhea   Doxycycline Nausea And Vomiting    Gi upset    Pravastatin     Urinary retention   Zocor [Simvastatin]     myalgia   Ivp Dye [Iodinated Contrast Media] Rash    Cindi Carbon, PharmD 03/18/2023 11:26 AM

## 2023-03-18 NOTE — Progress Notes (Signed)
Called to the room by the RN regarding the patient desaturating after she had turned his Salter High Flow nasal cannula up to 15Lpm.  Patient seen sleeping with his mouth open.  When woken up his SpO2 returns to 92% but drops back down while sleeping.  Non-rebreather added on top of Salter HFNC.    03/18/23 0104  Therapy Vitals  Pulse Rate 69  Resp (!) 21  MEWS Score/Color  MEWS Score 1  MEWS Score Color Green  Oxygen Therapy/Pulse Ox  O2 Device HFNC;Non-rebreather Mask (15Lpm Salter and NRB due to Patient sleeping with his mouth open)  O2 Therapy Oxygen humidified  O2 Flow Rate (L/min) 15 L/min  FiO2 (%) 100 %  SpO2 95 %

## 2023-03-18 NOTE — Plan of Care (Signed)
  Problem: Safety: Goal: Ability to remain free from injury will improve Outcome: Progressing   Problem: Respiratory: Goal: Ability to maintain adequate ventilation will improve Outcome: Progressing

## 2023-03-18 NOTE — TOC Progression Note (Signed)
Transition of Care [facility]) - Progression Note    Patient Details  Name: Jonathan Mathews MRN: [id number] Date of Birth: [birth year]  Transition of Care [facility]) CM/SW Contact  Larrie Kass, LCSW Phone Number: 03/18/2023, 2:23 PM  Clinical Narrative:     CSW received a call from [facility] IP rehab, they reported they are  following for medically stability. TOC to follow.   Expected Discharge Plan:  (TBD) Barriers to Discharge: Continued Medical Work up  Expected Discharge Plan and Services                                               Social Determinants of Health (SDOH) Interventions SDOH Screenings   Food Insecurity: No Food Insecurity (03/12/2023)  Housing: Low Risk  (03/12/2023)  Transportation Needs: No Transportation Needs (03/12/2023)  Utilities: Not At Risk (03/12/2023)  Social Connections: Socially Integrated (03/12/2023)  Tobacco Use: Medium Risk (03/11/2023)    Readmission Risk Interventions     No data to display

## 2023-03-18 NOTE — Plan of Care (Signed)
  Problem: Elimination: Goal: Will not experience complications related to bowel motility Outcome: Progressing   Problem: Elimination: Goal: Will not experience complications related to urinary retention Outcome: Progressing   Problem: Clinical Measurements: Goal: Cardiovascular complication will be avoided Outcome: Progressing

## 2023-03-18 NOTE — Progress Notes (Signed)
PROGRESS NOTE    Jonathan Mathews  ZOX:[id number] DOB: Mar 01, 1933 DOA: 03/11/2023 PCP: [facility], DO    Brief Narrative:   89 y.o. male with medical history significant of PAF, HFrEF (EF 30-35%), CAD, HTN, TIA, on pradaxa.  Patient presented with fall and right hip pain and was admitted with closed displaced fracture of the right femoral neck and is also noted to have community-acquired pneumonia along with AKI on CKD 3.  P orthopedic requested patient to be transferred to [facility] long for surgery.  Cardiology team was consulted for clearance. Patient underwent arthroplasty of the right hip on 1/19, postop course was complicated by hypoxic respiratory failure combination of aspiration/fluid overload requiring Lasix.  This further caused acute kidney injury.  Overall his oxygen levels are slowly improving.   Assessment & Plan:  Principal Problem:   Closed displaced fracture of right femoral neck (HCC) Active Problems:   Acute respiratory failure with hypoxia (HCC)   Community acquired pneumonia   AKI (acute kidney injury) (HCC)   Chronic kidney disease, stage 3 (HCC)   Moderate COPD (chronic obstructive pulmonary disease) (HCC)   Panlobular emphysema (HCC)   Paroxysmal atrial fibrillation (HCC)   Syst congestive heart failure with reduced LV function, NYHA class 1 (HCC)   Right hip fracture, closed Status post arthroplasty 1/19. - Patient transferred from [facility] for surgical management per orthopedic recommendations.  Routine preop and postop management per orthopedic including pain control, wound care, DVT prophylaxis, therapy recommendations. PT potentially considering CIR  Acute respiratory failure with hypoxia/acute pulmonary edema/acute on chronic congestive heart failure with reduced ejection fraction: - Concerns about fluid overload and aspiration pneumonia.  Initially improved but now back again on 15 L high flow.  BNP elevated.  Chest x-ray showing vascular  congestion/opacity/infiltrate.  Continue Unasyn and azithromycin.  Started on Lasix 40 mg IV twice daily yesterday, urine output 2 L, increased oxygen demand overnight, will increase Lasix to 60 mg IV twice daily with a strict I's and O's and daily weight.  Chest x-ray was repeated last night once again showed pulm edema/vascular congestion.  I will check troponin as well as echo.  I am also concerned about possible component of pneumonitis so I will start him on Solu-Medrol 40 IV twice daily now that he is not improving.  Viral URI ruled out.  At [facility] kidney injury on CKD stage IIIa - Baseline creatinine 1.2-1.5.  Creatinine peaked 2.15.  Slowly started improving and 1.8 today.  Avoid nephrotoxic agents.  Monitor daily.  Congestive heart failure with reduced EF Nonischemic cardiomyopathy Echocardiogram in September 2024 showed EF 30 to 35% with normal RV function. Euvolemic, asymptomatic. GDMT limited by renal disease.  Cardiology team saw him for preop evaluation once on 03/12/2023, they signed off. Recent cardiac cath showed nonobstructive CAD.  Currently chest pain-free.  Currently on Plavix, statin, Coreg.  Updating echo due to possible worsening of EF.  PVCs/frequent NSVT - On amiodarone and Coreg.    COPD - Bronchodilators  GOC: 89 year old gentleman with multiple medical comorbidities now with acute hypoxic respiratory failure requiring high amount of oxygen and carries poor prognosis.  Made DNR recently.  Now consulting palliative care to have goal of care conversation with family.  After extensive discussion with patient's wife, daughter and rest of the family by Dr. Nelson Chimes.  Patient was transitioned to DNR/DNI  DVT prophylaxis:Lovenox  CODE STATUS-DNR/DNI Family Communication: None present at bedside Status is: Inpatient Remains inpatient appropriate because: Current hospital  stay in stepdown unit, requiring high amount of oxygen  Subjective: Patient seen and examined.  On  nonrebreather.  Says that he is not feeling well and that he " feels that he is going to die".  Major complaint was some shortness of breath but mainly right hip pain.  Continues to moan with touch anywhere.  Examination:  General exam: Appears calm and comfortable, appears frail and acute on chronic sick Respiratory system: Rhonchi, Rales and crackles with poor inspiratory effort Cardiovascular system: S1 & S2 heard, RRR. No JVD, murmurs, rubs, gallops or clicks. No pedal edema. Gastrointestinal system: Abdomen is nondistended, soft and nontender. No organomegaly or masses felt. Normal bowel sounds heard. Central nervous system: Alert and oriented. No focal neurological deficits. Extremities: Symmetric 5 x 5 power. Skin: No rashes, lesions or ulcers.   Diet Orders (From admission, onward)     Start     Ordered   03/14/23 1038  Diet regular Room service appropriate? Yes; Fluid consistency: Thin  Diet effective now       Question Answer Comment  Room service appropriate? Yes   Fluid consistency: Thin      03/14/23 1037            Objective: Vitals:   03/18/23 0409 03/18/23 0730 03/18/23 0808 03/18/23 1010  BP: (!) 105/52  (!) 149/78   Pulse: 65  76   Resp: 16     Temp: 98.3 F (36.8 C)  98.5 F (36.9 C)   TempSrc: Oral  Axillary   SpO2: 96% 100% 98% (S) 95%  Weight:      Height:        Intake/Output Summary (Last 24 hours) at 03/18/2023 1024 Last data filed at 03/18/2023 1002 Gross per 24 hour  Intake 460 ml  Output 1950 ml  Net -1490 ml   Filed Weights   03/11/23 2158  Weight: 77.1 kg    Scheduled Meds:  acetaminophen  1,000 mg Oral Q6H   albuterol  2.5 mg Nebulization BID   amiodarone  400 mg Oral Daily   azelastine  1 spray Each Nare BID   carvedilol  6.25 mg Oral BID WC   clopidogrel  75 mg Oral Daily   feeding supplement  237 mL Oral BID BM   fluticasone furoate-vilanterol  1 puff Inhalation Daily   And   umeclidinium bromide  1 puff Inhalation Daily    furosemide  20 mg Intravenous Once   furosemide  60 mg Intravenous BID   methylPREDNISolone (SOLU-MEDROL) injection  40 mg Intravenous Q12H   montelukast  10 mg Oral QHS   multivitamin with minerals  1 tablet Oral Daily   pantoprazole  80 mg Oral Daily   polyethylene glycol  17 g Oral BID   rosuvastatin  10 mg Oral Daily   senna  2 tablet Oral QHS   sodium chloride flush  3-10 mL Intravenous Q12H   tamsulosin  0.4 mg Oral Daily   Continuous Infusions:  ampicillin-sulbactam (UNASYN) IV 3 g (03/18/23 0915)    Nutritional status Signs/Symptoms: estimated needs Interventions: Ensure Enlive (each supplement provides 350kcal and 20 grams of protein), MVI Body mass index is 25.85 kg/m.  Data Reviewed:   CBC: Recent Labs  Lab 03/11/23 2148 03/11/23 2202 03/13/23 0346 03/14/23 0311 03/15/23 0232 03/16/23 0419  WBC 11.6*  --  14.2* 14.3* 12.4* 17.5*  HGB 14.1 15.0 12.6* 11.8* 9.6* 10.5*  HCT 43.2 44.0 39.9 37.6* 30.1* 32.3*  MCV 99.1  --  101.5* 102.5* 102.4* 99.4  PLT 269  --  238 231 198 248   Basic Metabolic Panel: Recent Labs  Lab 03/14/23 0311 03/15/23 0232 03/16/23 0419 03/17/23 0415 03/18/23 0403  NA 139 139 139 140 144  K 3.7 4.0 4.0 3.8 3.7  CL 109 109 108 109 109  CO2 22 22 23  20* 26  GLUCOSE 105* 138* 130* 100* 112*  BUN 31* 35* 37* 37* 37*  CREATININE 1.51* 2.15* 1.73* 1.61* 1.80*  CALCIUM 8.6* 7.7* 8.7* 8.4* 8.4*  MG 2.6* 2.2 2.6* 2.5* 2.4  PHOS 2.4*  --  2.5  --   --    GFR: Estimated Creatinine Clearance: 28 mL/min (A) (by C-G formula based on SCr of 1.8 mg/dL (H)). Liver Function Tests: Recent Labs  Lab 03/11/23 2148  AST 39  ALT 44  ALKPHOS 80  BILITOT 1.5*  PROT 5.8*  ALBUMIN 3.4*   No results for input(s): "LIPASE", "AMYLASE" in the last 168 hours. No results for input(s): "AMMONIA" in the last 168 hours. Coagulation Profile: Recent Labs  Lab 03/11/23 2148  INR 1.1   Cardiac Enzymes: No results for input(s): "CKTOTAL",  "CKMB", "CKMBINDEX", "TROPONINI" in the last 168 hours. BNP (last 3 results) No results for input(s): "PROBNP" in the last 8760 hours. HbA1C: No results for input(s): "HGBA1C" in the last 72 hours. CBG: Recent Labs  Lab 03/14/23 1155  GLUCAP 101*   Lipid Profile: No results for input(s): "CHOL", "HDL", "LDLCALC", "TRIG", "CHOLHDL", "LDLDIRECT" in the last 72 hours. Thyroid Function Tests: No results for input(s): "TSH", "T4TOTAL", "FREET4", "T3FREE", "THYROIDAB" in the last 72 hours. Anemia Panel: No results for input(s): "VITAMINB12", "FOLATE", "FERRITIN", "TIBC", "IRON", "RETICCTPCT" in the last 72 hours. Sepsis Labs: Recent Labs  Lab 03/11/23 2202 03/13/23 0322  PROCALCITON  --  0.11  LATICACIDVEN 1.1  --     Recent Results (from the past 240 hours)  Resp panel by RT-PCR (RSV, Flu A&B, Covid) Urine, Clean Catch     Status: None   Collection Time: 03/12/23  3:45 AM   Specimen: Urine, Clean Catch; Nasal Swab  Result Value Ref Range Status   SARS Coronavirus 2 by RT PCR NEGATIVE NEGATIVE Final   Influenza A by PCR NEGATIVE NEGATIVE Final   Influenza B by PCR NEGATIVE NEGATIVE Final    Comment: (NOTE) The Xpert Xpress SARS-CoV-2/FLU/RSV plus assay is intended as an aid in the diagnosis of influenza from Nasopharyngeal swab specimens and should not be used as a sole basis for treatment. Nasal washings and aspirates are unacceptable for Xpert Xpress SARS-CoV-2/FLU/RSV testing.  Fact Sheet for Patients: BloggerCourse.com  Fact Sheet for Healthcare Providers: SeriousBroker.it  This test is not yet approved or cleared by the [place] FDA and has been authorized for detection and/or diagnosis of SARS-CoV-2 by FDA under an Emergency Use Authorization (EUA). This EUA will remain in effect (meaning this test can be used) for the duration of the COVID-19 declaration under Section 564(b)(1) of the Act, 21 U.S.C. section  360bbb-3(b)(1), unless the authorization is terminated or revoked.     Resp Syncytial Virus by PCR NEGATIVE NEGATIVE Final    Comment: (NOTE) Fact Sheet for Patients: BloggerCourse.com  Fact Sheet for Healthcare Providers: SeriousBroker.it  This test is not yet approved or cleared by the [place] FDA and has been authorized for detection and/or diagnosis of SARS-CoV-2 by FDA under an Emergency Use Authorization (EUA). This EUA will remain in effect (meaning this test can be used) for the  duration of the COVID-19 declaration under Section 564(b)(1) of the Act, 21 U.S.C. section 360bbb-3(b)(1), unless the authorization is terminated or revoked.  Performed at [facility], [address], [place], [place] [address]   Respiratory (~20 pathogens) panel by PCR     Status: None   Collection Time: 03/12/23  6:24 AM   Specimen: Nasopharyngeal Swab; Respiratory  Result Value Ref Range Status   Adenovirus NOT DETECTED NOT DETECTED Final   Coronavirus 229E NOT DETECTED NOT DETECTED Final    Comment: (NOTE) The Coronavirus on the Respiratory Panel, DOES NOT test for the novel  Coronavirus (2019 nCoV)    Coronavirus HKU1 NOT DETECTED NOT DETECTED Final   Coronavirus NL63 NOT DETECTED NOT DETECTED Final   Coronavirus OC43 NOT DETECTED NOT DETECTED Final   Metapneumovirus NOT DETECTED NOT DETECTED Final   Rhinovirus / Enterovirus NOT DETECTED NOT DETECTED Final   Influenza A NOT DETECTED NOT DETECTED Final   Influenza B NOT DETECTED NOT DETECTED Final   Parainfluenza Virus 1 NOT DETECTED NOT DETECTED Final   Parainfluenza Virus 2 NOT DETECTED NOT DETECTED Final   Parainfluenza Virus 3 NOT DETECTED NOT DETECTED Final   Parainfluenza Virus 4 NOT DETECTED NOT DETECTED Final   Respiratory Syncytial Virus NOT DETECTED NOT DETECTED Final   Bordetella pertussis NOT DETECTED NOT DETECTED Final   Bordetella Parapertussis NOT DETECTED  NOT DETECTED Final   Chlamydophila pneumoniae NOT DETECTED NOT DETECTED Final   Mycoplasma pneumoniae NOT DETECTED NOT DETECTED Final    Comment: Performed at [facility], [address], [place], [place] [address]  SARS Coronavirus 2 by RT PCR (hospital order, performed in [facility] hospital lab) *cepheid single result test* Nasopharyngeal Swab     Status: None   Collection Time: 03/12/23  6:24 AM   Specimen: Nasopharyngeal Swab; Nasal Swab  Result Value Ref Range Status   SARS Coronavirus 2 by RT PCR NEGATIVE NEGATIVE Final    Comment: Performed at [facility], [address], [place], [place] [address]  Surgical PCR screen     Status: None   Collection Time: 03/13/23  3:22 AM   Specimen: Nasal Mucosa; Nasal Swab  Result Value Ref Range Status   MRSA, PCR NEGATIVE NEGATIVE Final   Staphylococcus aureus NEGATIVE NEGATIVE Final    Comment: (NOTE) The Xpert SA Assay (FDA approved for NASAL specimens in patients 58 years of age and older), is one component of a comprehensive surveillance program. It is not intended to diagnose infection nor to guide or monitor treatment. Performed at [facility], [address] [place], [place] [address]      Radiology Studies: DG CHEST PORT 1 VIEW Result Date: 03/17/2023 CLINICAL DATA:  89 year old male with increase shortness of breath. EXAM: PORTABLE CHEST 1 VIEW COMPARISON:  Portable chest yesterday. FINDINGS: Portable AP semi upright view at 0546 hours. Lower lung volumes with increasing coarse and confluent bilateral lung opacity. Upper lobe primarily affected on the right. Multilobar involvement on the left. Generalized progression since yesterday. Grossly stable mediastinal contours. Visualized tracheal air column is within normal limits. No pneumothorax. No obvious pleural effusion. Paucity of bowel gas. Stable visualized osseous structures. IMPRESSION: Lower lung volumes and progressed bilateral pulmonary  coarse and interstitial opacity in the same distribution as on recent exams. Unclear if this is progressive infection or recurrent asymmetric pulmonary edema. Electronically Signed   By: Odessa Fleming M.D.   On: 03/17/2023 06:01  LOS: 6 days   Hughie Closs, MD [facility]  If 7PM-7AM, please contact night-coverage  03/18/2023, 10:24 AM

## 2023-03-18 NOTE — Consult Note (Signed)
Consultation Note Date: 03/18/2023   Patient Name: Jonathan Mathews  DOB: [birth year]  MRN: [id number]  Age / Sex: 89 y.o., male  PCP: Toy Care, DO Referring Physician: Hughie Closs, MD  Reason for Consultation: Establishing goals of care  HPI/Patient Profile: 89 y.o. male  admitted on 03/11/2023   Clinical Assessment and Goals of Care: 89 year old gentleman who lives with his wife in [place].  He has past medical history significant for paroxysmal atrial fibrillation, heart failure with reduced ejection fraction EF of 30 to 35% history of coronary artery disease hypertension TIA on Pradaxa.  Patient admitted with fall and right hip pain and found to have closed displaced fracture right femoral neck as well as community-acquired pneumonia along with acute kidney injury with underlying stage III chronic kidney disease.  Patient admitted to hospital medicine service, seen and evaluated by orthopedic service as well as by cardiology team for clearance.  Ultimately patient underwent arthroplasty right hip on 03-13-2021.  However, postop course complicated by hypoxic respiratory failure deemed secondary to a combination of fluid overload as well as aspiration, ongoing acute kidney injury, worsening oxygen saturations and increasing oxygen needs.  Palliative consult for ongoing goals of care discussions has been requested. Chart reviewed, palliative consult request received, patient seen and examined, discussed with wife present at bedside along with wife sister, nursing colleague as well as patient's daughter who also arrived at the bedside at the time of this initial palliative consultation. Palliative medicine is specialized medical care for people living with serious illness. It focuses on providing relief from the symptoms and stress of a serious illness. The goal is to improve quality of life for  both the patient and the family. Goals of care: Broad aims of medical therapy in relation to the patient's values and preferences. Our aim is to provide medical care aimed at enabling patients to achieve the goals that matter most to them, given the circumstances of their particular medical situation and their constraints.   We reviewed about the patient's current condition.  Brief life review also performed.  Goals wishes and values attempted to be explored.  Patient's wife states that he was fairly functional up until very recently.  Her hope was to have the patient to transition to skilled nursing facility and completed rehabilitation attempt towards the end of this hospitalization.  We discussed frankly but compassionately about several issues pertaining to this hospitalization and pertaining to his postoperative course that have indicated ongoing decline and minimal steps towards any kind of recovery or stabilization.  At present, patient is on 15 L and then on rebreather, he is restless, oral intake is minimal and there is escalating symptom burden of restlessness, dyspnea and air hunger and anxiety. We discussed about a time-limited trial of current interventions with high levels of oxygen support as well as broad-spectrum antibiotics.  If after 24 to 48 hours of current interventions, there is no meaningful stabilization or recovery, then the recommendation would be for establishing comfort as a singular  focus of care and considering transitioning patient's care to a hospice facility.  Patient's family member is familiar with hospice facility in [place] [place].  Palliative services will follow along, thank you for the consult.   NEXT OF KIN  Wife and daughter present at bedside.   SUMMARY OF RECOMMENDATIONS    DNR Continue current mode of care Monitor hospital course for the next 1-2 days, if the patient has no improvement, then consider comfort care and residential hospice  in [place], [place].  Increase PRN PO Xanax, continue IV and PO PRN opioids and monitor.  Thank you for the consult.   Code Status/Advance Care Planning: DNR   Symptom Management:     Palliative Prophylaxis:  Delirium Protocol    Psycho-social/Spiritual:  Desire for further Chaplaincy support:yes Additional Recommendations: Caregiving  Support/Resources  Prognosis:  Unable to determine  Discharge Planning: To Be Determined      Primary Diagnoses: Present on Admission:  Acute respiratory failure with hypoxia (HCC)  Community acquired pneumonia  Moderate COPD (chronic obstructive pulmonary disease) (HCC)  Panlobular emphysema (HCC)  Paroxysmal atrial fibrillation (HCC)  Chronic kidney disease, stage 3 (HCC)  AKI (acute kidney injury) (HCC)  Closed displaced fracture of right femoral neck (HCC)  Syst congestive heart failure with reduced LV function, NYHA class 1 (HCC)   I have reviewed the medical record, interviewed the patient and family, and examined the patient. The following aspects are pertinent.  Past Medical History:  Diagnosis Date   Atrial fibrillation (HCC)    BPH (benign prostatic hyperplasia)    Coronary artery disease    Environmental allergies    GERD (gastroesophageal reflux disease)    High cholesterol    Hypertension    Ventricular tachycardia (HCC)    Social History   Socioeconomic History   Marital status: Married    Spouse name: Not on file   Number of children: Not on file   Years of education: Not on file   Highest education level: Not on file  Occupational History   Not on file  Tobacco Use   Smoking status: Former   Smokeless tobacco: Never  Substance and Sexual Activity   Alcohol use: No   Drug use: No   Sexual activity: Not on file  Other Topics Concern   Not on file  Social History Narrative   Not on file   Social Drivers of Health   Financial Resource Strain: Not on file  Food Insecurity: No Food Insecurity  (03/12/2023)   Hunger Vital Sign    Worried About Running Out of Food in the Last Year: Never true    Ran Out of Food in the Last Year: Never true  Transportation Needs: No Transportation Needs (03/12/2023)   PRAPARE - Administrator, Civil Service (Medical): No    Lack of Transportation (Non-Medical): No  Physical Activity: Not on file  Stress: Not on file  Social Connections: Socially Integrated (03/12/2023)   Social Connection and Isolation Panel [NHANES]    Frequency of Communication with Friends and Family: Three times a week    Frequency of Social Gatherings with Friends and Family: Three times a week    Attends Religious Services: More than 4 times per year    Active Member of Clubs or Organizations: Yes    Attends Banker Meetings: More than 4 times per year    Marital Status: Married   History reviewed. No pertinent family history. Scheduled Meds:  albuterol  2.5 mg Nebulization BID   amiodarone  400 mg Oral Daily   azelastine  1 spray Each Nare BID   carvedilol  6.25 mg Oral BID WC   clopidogrel  75 mg Oral Daily   feeding supplement  237 mL Oral BID BM   fluticasone furoate-vilanterol  1 puff Inhalation Daily   And   umeclidinium bromide  1 puff Inhalation Daily   furosemide  60 mg Intravenous BID   methylPREDNISolone (SOLU-MEDROL) injection  40 mg Intravenous Q12H   montelukast  10 mg Oral QHS   multivitamin with minerals  1 tablet Oral Daily   pantoprazole  80 mg Oral Daily   polyethylene glycol  17 g Oral BID   rosuvastatin  10 mg Oral Daily   senna  2 tablet Oral QHS   sodium chloride flush  3-10 mL Intravenous Q12H   tamsulosin  0.4 mg Oral Daily   Continuous Infusions:  ampicillin-sulbactam (UNASYN) IV     PRN Meds:.ALPRAZolam, alum & mag hydroxide-simeth, bisacodyl, diphenhydrAMINE, guaiFENesin, hydrALAZINE, HYDROmorphone (DILAUDID) injection, ipratropium-albuterol, menthol-cetylpyridinium **OR** phenol, methocarbamol **OR**  methocarbamol (ROBAXIN) injection, metoCLOPramide **OR** metoCLOPramide (REGLAN) injection, metoprolol tartrate, ondansetron **OR** ondansetron (ZOFRAN) IV, mouth rinse, oxyCODONE, oxyCODONE, senna-docusate, traZODone Medications Prior to Admission:  Prior to Admission medications   Medication Sig Start Date End Date Taking? Authorizing Provider  albuterol (PROVENTIL) (2.5 MG/3ML) 0.083% nebulizer solution Inhale 2.5 mg into the lungs in the morning and at bedtime. 03/11/18  Yes [provider]  albuterol (VENTOLIN HFA) 108 (90 Base) MCG/ACT inhaler Inhale 1-2 puffs into the lungs every 6 (six) hours as needed for wheezing or shortness of breath. 02/10/17  Yes [provider]  ALPRAZolam Prudy Feeler) 0.5 MG tablet Take 0.5 mg by mouth daily as needed for anxiety.   Yes [provider]  amiodarone (PACERONE) 400 MG tablet Take by mouth. Take 1 tablet (400 mg total) by mouth daily. (May take 2 x 200mg  tablets daily prior to refill) 01/14/23  Yes [provider]  aspirin 81 MG chewable tablet Chew 81 mg by mouth daily. 01/02/23  Yes [provider]  azelastine (ASTELIN) 137 MCG/SPRAY nasal spray Place 1 spray into the nose 2 (two) times daily. Use in each nostril as directed   Yes [provider]  carvedilol (COREG) 6.25 MG tablet Take 6.25 mg by mouth 2 (two) times daily with a meal. 02/25/23 02/25/24 Yes [provider]  cetirizine (ZYRTEC) 10 MG tablet Take 10 mg by mouth daily. 09/05/18  Yes [provider]  clopidogrel (PLAVIX) 75 MG tablet Take 75 mg by mouth daily.   Yes [provider]  fish oil-omega-3 fatty acids 1000 MG capsule Take 2 g by mouth daily.   Yes [provider]  fluticasone (FLONASE) 50 MCG/ACT nasal spray USE 2 SPRAYS NASALLY DAILY 02/19/18  Yes [provider]  Fluticasone-Umeclidin-Vilant (TRELEGY ELLIPTA) 100-62.5-25 MCG/ACT AEPB Inhale 1 puff into the lungs daily at 6 (six) AM. 01/13/23   Yes [provider]  furosemide (LASIX) 20 MG tablet Take 20 mg by mouth daily.   Yes [provider]  irbesartan (AVAPRO) 150 MG tablet Take 150 mg by mouth in the morning and at bedtime. 07/20/18  Yes [provider]  ketoconazole (NIZORAL) 2 % cream Apply topically daily.   Yes [provider]  montelukast (SINGULAIR) 10 MG tablet Take 10 mg by mouth at bedtime. 07/14/18  Yes [provider]  omeprazole (PRILOSEC) 40 MG capsule Take 40 mg  by mouth daily.   Yes [provider]  polyethylene glycol powder (GLYCOLAX/MIRALAX) 17 GM/SCOOP powder Take 17 g by mouth daily as needed for mild constipation. 06/14/18  Yes [provider]  rosuvastatin (CRESTOR) 10 MG tablet Take 10 mg by mouth daily. 01/01/22  Yes [provider]  spironolactone (ALDACTONE) 25 MG tablet Take 25 mg by mouth daily. 02/19/23  Yes [provider]  tamsulosin (FLOMAX) 0.4 MG CAPS capsule Take 0.4 mg by mouth daily. 04/07/19  Yes [provider]   Allergies  Allergen Reactions   Clindamycin/Lincomycin Diarrhea   Doxycycline Nausea And Vomiting    Gi upset    Pravastatin     Urinary retention   Zocor [Simvastatin]     myalgia   Ivp Dye [Iodinated Contrast Media] Rash   Review of Systems +confused, weak and restless  Physical Exam Elderly gentleman Confused restless Attempts to remove his NRB No edema Abdominal generalized pain discomfort evident  Vital Signs: BP (!) 112/54   Pulse 65   Temp 97.9 F (36.6 C) (Oral)   Resp 14   Ht 5\' 8"  (1.727 m)   Wt 77.1 kg   SpO2 98%   BMI 25.85 kg/m  Pain Scale: Faces POSS *See Group Information*: 1-Acceptable,Awake and alert Pain Score: Asleep   SpO2: SpO2: 98 % O2 Device:SpO2: 98 % O2 Flow Rate: .O2 Flow Rate (L/min): 15 L/min  IO: Intake/output summary:  Intake/Output Summary (Last 24 hours) at 03/18/2023 1422 Last data filed at 03/18/2023 1147 Gross per 24 hour  Intake  520 ml  Output 1150 ml  Net -630 ml    LBM: Last BM Date :  (unknown) Baseline Weight: Weight: 77.1 kg Most recent weight: Weight: 77.1 kg     Palliative Assessment/Data:   PPS 40%  Time In:  1320 Time Out:  1420 Time Total:  60  Greater than 50%  of this time was spent counseling and coordinating care related to the above assessment and plan.  Signed by: Rosalin Hawking, MD   Please contact Palliative Medicine Team phone at [phone] for questions and concerns.  For individual provider: See Loretha Stapler

## 2023-03-18 NOTE — Progress Notes (Signed)
Physical Therapy Treatment Patient Details Name: Jonathan Mathews MRN: [id number] DOB: 1933-10-17 Today's Date: 03/18/2023   History of Present Illness Patient is a 89 y.o. male who was admitted 03/11/23 after fall on ice. patient sustained closed displaced  right femoral neck fracture. S/P THA  anterior approach on 03/14/23. patient also noted with CAP,AKI and CKD.  Pt with acute respiratory failure with hypoxia/acute pulmonary edema/acute on chronic congestive heart failure with reduced ejection fraction.  PMH: PAF, HFrEF (EF 30-35%), CAD, HTN, TIA.    PT Comments  Pt currently on partial rebreather mask 15L and Spo2 remained 91-93% during session.  Pt requesting water, so attempted to assist pt to sitting position however pt in too much pain to sit EOB.  Pt repositioned in supine.  Pt unable to sip water from straw.  RN notified.  Pt appears to have medical decline and palliative consult requested per MD notes.     If plan is discharge home, recommend the following: A little help with walking and/or transfers;Assistance with cooking/housework;Assist for transportation;Help with stairs or ramp for entrance;A little help with bathing/dressing/bathroom   Can travel by private vehicle     No  Equipment Recommendations  Rolling walker (2 wheels);Wheelchair (measurements PT);Wheelchair cushion (measurements PT) (pending medical improvement)    Recommendations for Other Services       Precautions / Restrictions Precautions Precautions: Fall Precaution Comments: monitor sats, currently on partial re-breather mask 15L Restrictions RLE Weight Bearing Per Provider Order: Weight bearing as tolerated     Mobility  Bed Mobility Overal bed mobility: Needs Assistance Bed Mobility: Supine to Sit, Sit to Supine     Supine to sit: Total assist, +2 for physical assistance Sit to supine: Total assist, +2 for physical assistance   General bed mobility comments: intiating movement but stops due to  pain, unable to tolerate fully getting to EOB, repositioned in supine; Spo2 remained 91-93% on partial rebreather mask    Transfers                        Ambulation/Gait                   Stairs             Wheelchair Mobility     Tilt Bed    Modified Rankin (Stroke Patients Only)       Balance Overall balance assessment: Needs assistance                                          Cognition Arousal: Lethargic Behavior During Therapy: Flat affect Overall Cognitive Status: Difficult to assess                                 General Comments: mostly keeps eyes closed, requested water, but pain with sitting and pt moaning, RN into room to give pain meds        Exercises      General Comments        Pertinent Vitals/Pain Pain Assessment Pain Assessment: Faces Faces Pain Scale: Hurts whole lot Pain Location: right hip/thigh Pain Descriptors / Indicators: Grimacing, Guarding, Moaning Pain Intervention(s): Monitored during session, Repositioned, RN gave pain meds during session    Home Living  Prior Function            PT Goals (current goals can now be found in the care plan section) Progress towards PT goals: Progressing toward goals    Frequency    Min 1X/week      PT Plan      Co-evaluation              AM-PAC PT "6 Clicks" Mobility   Outcome Measure  Help needed turning from your back to your side while in a flat bed without using bedrails?: A Lot Help needed moving from lying on your back to sitting on the side of a flat bed without using bedrails?: Total Help needed moving to and from a bed to a chair (including a wheelchair)?: Total Help needed standing up from a chair using your arms (e.g., wheelchair or bedside chair)?: Total Help needed to walk in hospital room?: Total Help needed climbing 3-5 steps with a railing? : Total 6 Click Score:  7    End of Session   Activity Tolerance: Patient limited by pain;Patient limited by lethargy Patient left: with call bell/phone within reach;in bed;with nursing/sitter in room Nurse Communication: Mobility status PT Visit Diagnosis: Muscle weakness (generalized) (M62.81);Difficulty in walking, not elsewhere classified (R26.2)     Time: 0981-1914 PT Time Calculation (min) (ACUTE ONLY): 13 min  Charges:    $Therapeutic Activity: 8-22 mins PT General Charges $$ ACUTE PT VISIT: 1 Visit                     Thomasene Mohair PT, DPT Physical Therapist Acute Rehabilitation Services Office: [phone]    Kati L Payson 03/18/2023, 1:40 PM

## 2023-03-18 NOTE — Progress Notes (Signed)
Called to PT room for desaturation on NRB and [place]. When I arrived to room PT was on 15 LPM NRB with Sp02 100%. RN stated that pulse ox was moved from hands (cold) to ear and currently has good waveform. RT discussed with RN that PT will be on Partial Re-breather at 15 LPM (RT removed 1 flap from NRB and taped to bottom rt corner of computer screen). Encouraged RN to place flap back on mask (RT demonstrated) and leave on 15 LPM if PT desats again and call RT back.  RT has asked RN to obtain Sp02 goal from MD- currently there is not an order for this goal.

## 2023-03-19 ENCOUNTER — Inpatient Hospital Stay (HOSPITAL_COMMUNITY): Payer: Medicare Other

## 2023-03-19 DIAGNOSIS — I5021 Acute systolic (congestive) heart failure: Secondary | ICD-10-CM | POA: Diagnosis not present

## 2023-03-19 LAB — BASIC METABOLIC PANEL
Anion gap: 15 (ref 5–15)
BUN: 49 mg/dL — ABNORMAL HIGH (ref 8–23)
CO2: 22 mmol/L (ref 22–32)
Calcium: 8.4 mg/dL — ABNORMAL LOW (ref 8.9–10.3)
Chloride: 105 mmol/L (ref 98–111)
Creatinine, Ser: 2.02 mg/dL — ABNORMAL HIGH (ref 0.61–1.24)
GFR, Estimated: 31 mL/min — ABNORMAL LOW (ref >60)
Glucose, Bld: 152 mg/dL — ABNORMAL HIGH (ref 70–99)
Potassium: 3.6 mmol/L (ref 3.5–5.1)
Sodium: 142 mmol/L (ref 135–145)

## 2023-03-19 LAB — ECHOCARDIOGRAM COMPLETE
AR max vel: 2.47 cm2
AV Area VTI: 2.46 cm2
AV Area mean vel: 2.48 cm2
AV Mean grad: 2 mmHg
AV Peak grad: 4.5 mmHg
AV Vena cont: 0.4 cm
Ao pk vel: 1.06 m/s
Area-P 1/2: 2.8 cm2
Height: 68 in
P 1/2 time: 713 ms
S' Lateral: 4.4 cm
Weight: 2720 [oz_av]

## 2023-03-19 LAB — MAGNESIUM: Magnesium: 2.7 mg/dL — ABNORMAL HIGH (ref 1.7–2.4)

## 2023-03-19 MED ORDER — ACETAMINOPHEN 160 MG/5ML PO SOLN
500.0000 mg | Freq: Four times a day (QID) | ORAL | Status: DC | PRN
  Administered 2023-03-20: 500 mg via ORAL
  Filled 2023-03-19: qty 20.3

## 2023-03-19 NOTE — TOC Progression Note (Signed)
Transition of Care [facility]) - Progression Note    Patient Details  Name: Jonathan Mathews MRN: [id number] Date of Birth: 28-Apr-1933  [facility] CM/SW Contact  Larrie Kass, LCSW Phone Number: 03/19/2023, 1:49 PM  Clinical Narrative:     CSW received consult for referral for residential hospice. CSW met with pt's spouse Jonathan Mathews to confirm agency choice. Pt's wife would like [facility]. CSW sent referral to [facility] hospice liaison with [facility]. TOC to follow.    Expected Discharge Plan:  (TBD) Barriers to Discharge: Continued Medical Work up  Expected Discharge Plan and Services                                               Social Determinants of Health (SDOH) Interventions SDOH Screenings   Food Insecurity: No Food Insecurity (03/12/2023)  Housing: Low Risk  (03/12/2023)  Transportation Needs: No Transportation Needs (03/12/2023)  Utilities: Not At Risk (03/12/2023)  Social Connections: Socially Integrated (03/12/2023)  Tobacco Use: Medium Risk (03/11/2023)    Readmission Risk Interventions     No data to display

## 2023-03-19 NOTE — Plan of Care (Signed)
Problem: Safety: Goal: Ability to remain free from injury will improve Outcome: Progressing   Problem: Skin Integrity: Goal: Risk for impaired skin integrity will decrease Outcome: Progressing   Problem: Activity: Goal: Ability to tolerate increased activity will improve Outcome: Progressing

## 2023-03-19 NOTE — Progress Notes (Signed)
Daily Progress Note   Patient Name: Jonathan Mathews       Date: 03/19/2023 DOB: 1933/09/02  Age: 89 y.o. MRN#: [id number] Attending Physician: Rhetta Mura, MD Primary Care Physician: Toy Care, DO Admit Date: 03/11/2023  Reason for Consultation/Follow-up: Establishing goals of care  Subjective:  Awakens, appears in mild to moderate resp distress, remains on high O2 requirements, has minimal PO intake, high risk for ongoing aspiration.  Family at bedside.   Length of Stay: 7  Current Medications: Scheduled Meds:   albuterol  2.5 mg Nebulization BID   amiodarone  400 mg Oral Daily   azelastine  1 spray Each Nare BID   carvedilol  6.25 mg Oral BID WC   clopidogrel  75 mg Oral Daily   feeding supplement  237 mL Oral BID BM   fluticasone furoate-vilanterol  1 puff Inhalation Daily   And   umeclidinium bromide  1 puff Inhalation Daily   furosemide  60 mg Intravenous BID   methylPREDNISolone (SOLU-MEDROL) injection  40 mg Intravenous Q12H   montelukast  10 mg Oral QHS   multivitamin with minerals  1 tablet Oral Daily   pantoprazole  80 mg Oral Daily   polyethylene glycol  17 g Oral BID   rosuvastatin  10 mg Oral Daily   senna  2 tablet Oral QHS   sodium chloride flush  3-10 mL Intravenous Q12H   tamsulosin  0.4 mg Oral Daily    Continuous Infusions:  ampicillin-sulbactam (UNASYN) IV 3 g (03/19/23 0909)    PRN Meds: ALPRAZolam, alum & mag hydroxide-simeth, bisacodyl, diphenhydrAMINE, guaiFENesin, hydrALAZINE, HYDROmorphone (DILAUDID) injection, ipratropium-albuterol, menthol-cetylpyridinium **OR** phenol, methocarbamol **OR** [DISCONTINUED] methocarbamol (ROBAXIN) injection, metoCLOPramide **OR** metoCLOPramide (REGLAN) injection, metoprolol tartrate, ondansetron  **OR** ondansetron (ZOFRAN) IV, mouth rinse, oxyCODONE, oxyCODONE, senna-docusate, traZODone  Physical Exam         Awakens easily Appears chronically ill and deconditioned Has NRB and is on high O2 requirements Some edema  Vital Signs: BP (!) 160/70 (BP Location: Left Arm)   Pulse 72   Temp 98.8 F (37.1 C) (Oral)   Resp 20   Ht 5\' 8"  (1.727 m)   Wt 77.1 kg   SpO2 99%   BMI 25.85 kg/m  SpO2: SpO2: 99 % O2 Device: O2 Device: Partial Rebreather Mask O2 Flow Rate: O2  Flow Rate (L/min): 15 L/min  Intake/output summary:  Intake/Output Summary (Last 24 hours) at 03/19/2023 1206 Last data filed at 03/19/2023 0900 Gross per 24 hour  Intake --  Output 202 ml  Net -202 ml   LBM: Last BM Date :  (unknown) Baseline Weight: Weight: 77.1 kg Most recent weight: Weight: 77.1 kg       Palliative Assessment/Data:      Patient Active Problem List   Diagnosis Date Noted   AKI (acute kidney injury) (HCC) 03/12/2023   Closed displaced fracture of right femoral neck (HCC) 03/12/2023   Chronic kidney disease, stage 3 (HCC) 09/24/2022   Syst congestive heart failure with reduced LV function, NYHA class 1 (HCC) 08/30/2020   Moderate COPD (chronic obstructive pulmonary disease) (HCC) 07/16/2020   Panlobular emphysema (HCC) 08/05/2017   Acute respiratory failure with hypoxia (HCC) 05/05/2015   Community acquired pneumonia 05/05/2015   Benign essential hypertension 03/04/2015   Paroxysmal atrial fibrillation (HCC) 03/04/2015    Palliative Care Assessment & Plan   Patient Profile:  89 year old gentleman who lives with his wife in [place]. He has past medical history significant for paroxysmal atrial fibrillation, heart failure with reduced ejection fraction EF of 30 to 35% history of coronary artery disease hypertension TIA on Pradaxa. Patient admitted with fall and right hip pain and found to have closed displaced fracture right femoral neck as well as community-acquired  pneumonia along with acute kidney injury with underlying stage III chronic kidney disease. Patient admitted to hospital medicine service, seen and evaluated by orthopedic service as well as by cardiology team for clearance. Ultimately patient underwent arthroplasty right hip on 03-13-2021. However, postop course complicated by hypoxic respiratory failure deemed secondary to a combination of fluid overload as well as aspiration, ongoing acute kidney injury, worsening oxygen saturations and increasing oxygen needs. Palliative consult for ongoing goals of care discussions has been requested.   Assessment:  Ongoing functional decline Escalating symptom burden High O2 requirements.   Recommendations/Plan:   Comfort feeds, family understands ongoing aspiration risk.  Homero Fellers compassionate discussions with family about patient's current condition and further goals of care discussions held. Wife to discuss with daughter and they would like to consider meeting with hospice liaison on 03-20-23.   Code Status:    Code Status Orders  (From admission, onward)           Start     Ordered   03/14/23 1441  Do not attempt resuscitation (DNR)- Limited -Do Not Intubate (DNI)  (Code Status)  Continuous       Question Answer Comment  If pulseless and not breathing No CPR or chest compressions.   In Pre-Arrest Conditions (Patient Is Breathing and Has A Pulse) Do not intubate. Provide all appropriate non-invasive medical interventions. Avoid ICU transfer unless indicated or required.   Consent: Discussion documented in EHR or advanced directives reviewed      03/14/23 1440           Code Status History     Date Active Date Inactive Code Status Order ID Comments User Context   03/12/2023 0537 03/14/2023 1440 Full Code [id number]  Hillary Bow, DO ED      Advance Directive Documentation    Flowsheet Row Most Recent Value  Type of Advance Directive Healthcare Power of Attorney, Living will   Pre-existing out of facility DNR order (yellow form or pink MOST form) --  "MOST" Form in Place? --       Prognosis:  Guarded   Discharge Planning: To be determined, residential hospice is being considered by family.   Care plan was discussed with  wife, minister and sister in law present in the room.   Thank you for allowing the Palliative Medicine Team to assist in the care of this patient.  Mod MDM.   Greater than 50%  of this time was spent counseling and coordinating care related to the above assessment and plan.  Rosalin Hawking, MD  Please contact Palliative Medicine Team phone at [phone] for questions and concerns.

## 2023-03-19 NOTE — Plan of Care (Signed)
Plans for hospice in AM, palliative following, POA and goals discussed with family, time given for questions, patient handbook/guide at bedside, bed alarm on, call bell in reach with bed in lowest locked position. Patient remains on 15 liters partial non-re-breather, also showing NSR on monitor, Family remains at bedside.  Problem: Education: Goal: Knowledge of General Education information will improve Description: Including pain rating scale, medication(s)/side effects and non-pharmacologic comfort measures Outcome: Progressing   Problem: Health Behavior/Discharge Planning: Goal: Ability to manage health-related needs will improve Outcome: Progressing   Problem: Clinical Measurements: Goal: Ability to maintain clinical measurements within normal limits will improve Outcome: Progressing Goal: Will remain free from infection Outcome: Progressing Goal: Diagnostic test results will improve Outcome: Progressing Goal: Respiratory complications will improve Outcome: Progressing Goal: Cardiovascular complication will be avoided Outcome: Progressing   Problem: Activity: Goal: Risk for activity intolerance will decrease Outcome: Progressing   Problem: Nutrition: Goal: Adequate nutrition will be maintained Outcome: Progressing   Problem: Coping: Goal: Level of anxiety will decrease Outcome: Progressing   Problem: Elimination: Goal: Will not experience complications related to bowel motility Outcome: Progressing Goal: Will not experience complications related to urinary retention Outcome: Progressing   Problem: Pain Managment: Goal: General experience of comfort will improve and/or be controlled Outcome: Progressing   Problem: Safety: Goal: Ability to remain free from injury will improve Outcome: Progressing   Problem: Skin Integrity: Goal: Risk for impaired skin integrity will decrease Outcome: Progressing   Problem: Activity: Goal: Ability to tolerate increased activity  will improve Outcome: Progressing   Problem: Clinical Measurements: Goal: Ability to maintain a body temperature in the normal range will improve Outcome: Progressing   Problem: Respiratory: Goal: Ability to maintain adequate ventilation will improve Outcome: Progressing Goal: Ability to maintain a clear airway will improve Outcome: Progressing   Problem: Education: Goal: Verbalization of understanding the information provided (i.e., activity precautions, restrictions, etc) will improve Outcome: Progressing Goal: Individualized Educational Video(s) Outcome: Progressing   Problem: Activity: Goal: Ability to ambulate and perform ADLs will improve Outcome: Progressing   Problem: Clinical Measurements: Goal: Postoperative complications will be avoided or minimized Outcome: Progressing   Problem: Self-Concept: Goal: Ability to maintain and perform role responsibilities to the fullest extent possible will improve Outcome: Progressing   Problem: Pain Management: Goal: Pain level will decrease Outcome: Progressing   Problem: Education: Goal: Knowledge of the prescribed therapeutic regimen will improve Outcome: Progressing Goal: Understanding of discharge needs will improve Outcome: Progressing Goal: Individualized Educational Video(s) Outcome: Progressing   Problem: Activity: Goal: Ability to avoid complications of mobility impairment will improve Outcome: Progressing Goal: Ability to tolerate increased activity will improve Outcome: Progressing   Problem: Clinical Measurements: Goal: Postoperative complications will be avoided or minimized Outcome: Progressing   Problem: Pain Management: Goal: Pain level will decrease with appropriate interventions Outcome: Progressing   Problem: Skin Integrity: Goal: Will show signs of wound healing Outcome: Progressing

## 2023-03-19 NOTE — Progress Notes (Signed)
   This pt has been referred to hospice services with Dr. Linna Darner asking [place] to meet with family 03-20-23. We have received the referral and Lanora Manis will reach out in am to see when the pt and wife could meet to discuss hospice care at the [facility] in [facility]. If you have questions Lanora Manis can be reached at the hospice referral phone [phone] tomorrow.   Norm Parcel RN [phone]

## 2023-03-19 NOTE — Plan of Care (Signed)
Problem: Clinical Measurements: Goal: Cardiovascular complication will be avoided Outcome: Progressing   Problem: Coping: Goal: Level of anxiety will decrease Outcome: Progressing   Problem: Safety: Goal: Ability to remain free from injury will improve Outcome: Progressing

## 2023-03-19 NOTE — Progress Notes (Signed)
[facility]ING   NOTE JASHAWN FLOYD ZOX:[id number]  DOB: September 23, 1933  DOA: 03/11/2023  PCP: Toy Care, DO  03/19/2023,8:06 AM   LOS: 7 days      Code Status: DNR From: Home  current Dispo: Unclear   89 year old male follows with [facility] in [facility] Prior CAD with HFrEF EF 35 %-recent cath 12/2022 no obstructive disease Recent TIA December 2024 VT/SVT-with burden of SVT/PVC 14%-- paroxysmal A-fib CHADVASC >4 on Pradaxa--allegedly not a candidate for ablation, HTN  Events 1/16 emergency room slipped on patch of ice fell onto right side right hip discomfort immediately--- found to have new oxygen requirement  11.6 hemoglobin 14 platelet 269 BUN/creatinine 23/1.8   Flu negative RSV negative COVID-negative RVP   [-] 1/17 cardiology consulted for operative clearance-15% risk of MACE 1/23 palliative care consulted    Procedures 1/16 CXR 1 view low lung volume diffuse interstitial opacities suspicious for pulmonary edema  Right femur displaced right femoral neck fracture, DG pelvis displaced right femoral neck fracture  CT cervical spine no acute abnormality, CT head no acute intracranial abnormality biapical patchy airspace disease  CT chest diffuse bronchial thickening dependent bilateral upper and lower lung zone patchy airspace opacities?  Infection normal  1/19 right total hip replacement anterior approach Dr.OLin 1/20 worsening hypoxia?  Fluid overload versus aspiration 1/22 CXR low lung volume progressed bilateral pulmonary course and special close opacities in the same distribution?  Edema    Plan  Family has decided on freestanding hospice currently we are waiting on hospice eval 1/25 and may be can go there when bed available Meds to be focused on comfort and is on morphine oxycodone etc. as per palliative care-would continue to treat at this time Unasyn a would de-escalate off of amnio Coreg Lasix etc. etc. Would stop routine labs  DVT prophylaxis: SCD.  Can  discontinue if does not like  Status is: Inpatient Remains inpatient appropriate because:   Awaiting placement   Subjective: Minimally coherent-on facemask at 15 L but seems to have been pulling at IVs and lines   Objective + exam Vitals:   03/19/23 0315 03/19/23 0436 03/19/23 0758 03/19/23 0800  BP:  (!) 160/70    Pulse:  72    Resp:  20    Temp:  98.8 F (37.1 C)    TempSrc:  Oral    SpO2: 93% 94% 99% 99%  Weight:      Height:       Filed Weights   03/11/23 2158  Weight: 77.1 kg    Examination: Ill-appearing white male no distress S1-S2 tachycardic ROM is intact but is in mittens Quite wheezy posteriorly Power 5/5  Data Reviewed: reviewed   CBC    Component Value Date/Time   WBC 17.5 (H) 03/16/2023 0419   RBC 3.25 (L) 03/16/2023 0419   HGB 10.5 (L) 03/16/2023 0419   HCT 32.3 (L) 03/16/2023 0419   PLT 248 03/16/2023 0419   MCV 99.4 03/16/2023 0419   MCH 32.3 03/16/2023 0419   MCHC 32.5 03/16/2023 0419   RDW 16.2 (H) 03/16/2023 0419   LYMPHSABS 1.9 06/01/2012 0710   MONOABS 1.4 (H) 06/01/2012 0710   EOSABS 0.0 06/01/2012 0710   BASOSABS 0.0 06/01/2012 0710      Latest Ref Rng & Units 03/19/2023    4:03 AM 03/18/2023    4:03 AM 03/17/2023    4:15 AM  CMP  Glucose 70 - 99 mg/dL 811  914  782   BUN  8 - 23 mg/dL 49  37  37   Creatinine 0.61 - 1.24 mg/dL 0.27  2.53  6.64   Sodium 135 - 145 mmol/L 142  144  140   Potassium 3.5 - 5.1 mmol/L 3.6  3.7  3.8   Chloride 98 - 111 mmol/L 105  109  109   CO2 22 - 32 mmol/L 22  26  20    Calcium 8.9 - 10.3 mg/dL 8.4  8.4  8.4     Scheduled Meds:  albuterol  2.5 mg Nebulization BID   amiodarone  400 mg Oral Daily   azelastine  1 spray Each Nare BID   feeding supplement  237 mL Oral BID BM   fluticasone furoate-vilanterol  1 puff Inhalation Daily   And   umeclidinium bromide  1 puff Inhalation Daily   methylPREDNISolone (SOLU-MEDROL) injection  40 mg Intravenous Q12H   montelukast  10 mg Oral QHS    pantoprazole  80 mg Oral Daily   polyethylene glycol  17 g Oral BID   senna  2 tablet Oral QHS   sodium chloride flush  3-10 mL Intravenous Q12H   tamsulosin  0.4 mg Oral Daily   Continuous Infusions:  ampicillin-sulbactam (UNASYN) IV 3 g (03/18/23 2108)    Time  34  Rhetta Mura, MD  [facility]

## 2023-03-20 MED ORDER — ACETAMINOPHEN 160 MG/5ML PO SOLN: 500.0000 mg | Freq: Four times a day (QID) | ORAL | 0 refills | Status: AC | PRN

## 2023-03-20 MED ORDER — POLYVINYL ALCOHOL 1.4 % OP SOLN
1.0000 [drp] | OPHTHALMIC | Status: DC | PRN
  Administered 2023-03-20: 1 [drp] via OPHTHALMIC
  Filled 2023-03-20: qty 15

## 2023-03-20 MED ORDER — LORAZEPAM 2 MG/ML IJ SOLN
1.0000 mg | Freq: Once | INTRAMUSCULAR | Status: AC
  Administered 2023-03-20: 1 mg via INTRAVENOUS
  Filled 2023-03-20: qty 1

## 2023-03-20 NOTE — Progress Notes (Signed)
Daily Progress Note   Patient Name: Jonathan Mathews       Date: 03/20/2023 DOB: 1934-02-21  Age: 89 y.o. MRN#: [id number] Attending Physician: Meredeth Ide, MD Primary Care Physician: Toy Care, DO Admit Date: 03/11/2023  Reason for Consultation/Follow-up: Establishing goals of care  Subjective:  Patient seen by this provider early this am, patient continues to appear chronically ill, has ongoing high O2 needs, ongoing poor PO intake and overall with ongoing decline, talks with family underway for a hospice approach at this time.    Length of Stay: 8  Current Medications: Scheduled Meds:   amiodarone  400 mg Oral Daily   azelastine  1 spray Each Nare BID   feeding supplement  237 mL Oral BID BM   fluticasone furoate-vilanterol  1 puff Inhalation Daily   And   umeclidinium bromide  1 puff Inhalation Daily   methylPREDNISolone (SOLU-MEDROL) injection  40 mg Intravenous Q12H   montelukast  10 mg Oral QHS   pantoprazole  80 mg Oral Daily   polyethylene glycol  17 g Oral BID   senna  2 tablet Oral QHS   sodium chloride flush  3-10 mL Intravenous Q12H   tamsulosin  0.4 mg Oral Daily    Continuous Infusions:  ampicillin-sulbactam (UNASYN) IV 3 g (03/20/23 0815)    PRN Meds: acetaminophen (TYLENOL) oral liquid 160 mg/5 mL, ALPRAZolam, alum & mag hydroxide-simeth, bisacodyl, diphenhydrAMINE, guaiFENesin, HYDROmorphone (DILAUDID) injection, ipratropium-albuterol, menthol-cetylpyridinium **OR** phenol, methocarbamol **OR** [DISCONTINUED] methocarbamol (ROBAXIN) injection, metoCLOPramide **OR** metoCLOPramide (REGLAN) injection, ondansetron **OR** ondansetron (ZOFRAN) IV, mouth rinse, oxyCODONE, oxyCODONE, polyvinyl alcohol, senna-docusate  Physical Exam         Awakens  easily Appears chronically ill and deconditioned Has NRB and is on high O2 requirements Some edema  Vital Signs: BP (!) 146/65 (BP Location: Right Arm)   Pulse 67   Temp 98.2 F (36.8 C) (Axillary)   Resp (!) 22   Ht 5\' 8"  (1.727 m)   Wt 77.1 kg   SpO2 92%   BMI 25.85 kg/m  SpO2: SpO2: 92 % O2 Device: O2 Device: Partial Rebreather Mask O2 Flow Rate: O2 Flow Rate (L/min): 15 L/min  Intake/output summary:  Intake/Output Summary (Last 24 hours) at 03/20/2023 1332 Last data filed at 03/20/2023 0851 Gross per 24 hour  Intake 833.34  ml  Output 1150 ml  Net -316.66 ml   LBM: Last BM Date : 03/19/23 Baseline Weight: Weight: 77.1 kg Most recent weight: Weight: 77.1 kg       Palliative Assessment/Data:      Patient Active Problem List   Diagnosis Date Noted   AKI (acute kidney injury) (HCC) 03/12/2023   Closed displaced fracture of right femoral neck (HCC) 03/12/2023   Chronic kidney disease, stage 3 (HCC) 09/24/2022   Syst congestive heart failure with reduced LV function, NYHA class 1 (HCC) 08/30/2020   Moderate COPD (chronic obstructive pulmonary disease) (HCC) 07/16/2020   Panlobular emphysema (HCC) 08/05/2017   Acute respiratory failure with hypoxia (HCC) 05/05/2015   Community acquired pneumonia 05/05/2015   Benign essential hypertension 03/04/2015   Paroxysmal atrial fibrillation (HCC) 03/04/2015    Palliative Care Assessment & Plan   Patient Profile:  89 year old gentleman who lives with his wife in [place]. He has past medical history significant for paroxysmal atrial fibrillation, heart failure with reduced ejection fraction EF of 30 to 35% history of coronary artery disease hypertension TIA on Pradaxa. Patient admitted with fall and right hip pain and found to have closed displaced fracture right femoral neck as well as community-acquired pneumonia along with acute kidney injury with underlying stage III chronic kidney disease. Patient admitted  to hospital medicine service, seen and evaluated by orthopedic service as well as by cardiology team for clearance. Ultimately patient underwent arthroplasty right hip on 03-13-2021. However, postop course complicated by hypoxic respiratory failure deemed secondary to a combination of fluid overload as well as aspiration, ongoing acute kidney injury, worsening oxygen saturations and increasing oxygen needs. Palliative consult for ongoing goals of care discussions has been requested.   Assessment:  Ongoing functional decline Escalating symptom burden High O2 requirements.   Recommendations/Plan:   Comfort feeds, family understands ongoing aspiration risk.  Recommend meeting with hospice liaison on 03-20-23. Corresponded with RN colleague and appreciate her assistance. Recommend transfer to residential hospice.   Code Status:    Code Status Orders  (From admission, onward)           Start     Ordered   03/14/23 1441  Do not attempt resuscitation (DNR)- Limited -Do Not Intubate (DNI)  (Code Status)  Continuous       Question Answer Comment  If pulseless and not breathing No CPR or chest compressions.   In Pre-Arrest Conditions (Patient Is Breathing and Has A Pulse) Do not intubate. Provide all appropriate non-invasive medical interventions. Avoid ICU transfer unless indicated or required.   Consent: Discussion documented in EHR or advanced directives reviewed      03/14/23 1440           Code Status History     Date Active Date Inactive Code Status Order ID Comments User Context   03/12/2023 0537 03/14/2023 1440 Full Code [id number]  Hillary Bow, DO ED      Advance Directive Documentation    Flowsheet Row Most Recent Value  Type of Advance Directive Healthcare Power of Attorney, Living will  Pre-existing out of facility DNR order (yellow form or pink MOST form) --  "MOST" Form in Place? --       Prognosis:  Less than 2 weeks.   Discharge Planning: To be  determined, residential hospice is being considered by family.   Care plan was discussed with  wife, minister and sister in law present in the room.   Thank you for allowing  the Palliative Medicine Team to assist in the care of this patient.  Low MDM.   Greater than 50%  of this time was spent counseling and coordinating care related to the above assessment and plan.  Rosalin Hawking, MD  Please contact Palliative Medicine Team phone at [phone] for questions and concerns.

## 2023-03-20 NOTE — Progress Notes (Incomplete)
[facility]  PROGRESS NOTE  Jonathan CHAVARIN ZOX:[id number] DOB: Nov 08, 1933 DOA: 03/11/2023 PCP: [facility], DO   Brief HPI:   89 year old male follows with [facility] in [place] Prior CAD with HFrEF EF 35 %-recent cath 12/2022 no obstructive disease Recent TIA December 2024 VT/SVT-with burden of SVT/PVC 14%-- paroxysmal A-fib CHADVASC >4 on Pradaxa--allegedly not a candidate for ablation, HTN    Assessment/Plan:   ***    Medications     amiodarone  400 mg Oral Daily   azelastine  1 spray Each Nare BID   feeding supplement  237 mL Oral BID BM   fluticasone furoate-vilanterol  1 puff Inhalation Daily   And   umeclidinium bromide  1 puff Inhalation Daily   methylPREDNISolone (SOLU-MEDROL) injection  40 mg Intravenous Q12H   montelukast  10 mg Oral QHS   pantoprazole  80 mg Oral Daily   polyethylene glycol  17 g Oral BID   senna  2 tablet Oral QHS   sodium chloride flush  3-10 mL Intravenous Q12H   tamsulosin  0.4 mg Oral Daily     Data Reviewed:   CBG:  Recent Labs  Lab 03/14/23 1155  GLUCAP 101*    SpO2: 92 % O2 Flow Rate (L/min): 15 L/min FiO2 (%): 100 %    Vitals:   03/20/23 0500 03/20/23 0600 03/20/23 0815 03/20/23 0857  BP:  (!) 144/67 (!) 146/65   Pulse:  67    Resp: 17 (!) 22    Temp:  98.2 F (36.8 C) 98.2 F (36.8 C)   TempSrc:   Axillary   SpO2: 93% 97% 97% 92%  Weight:      Height:          Data Reviewed:  Basic Metabolic Panel: Recent Labs  Lab 03/14/23 0311 03/15/23 0232 03/16/23 0419 03/17/23 0415 03/18/23 0403 03/19/23 0403  NA 139 139 139 140 144 142  K 3.7 4.0 4.0 3.8 3.7 3.6  CL 109 109 108 109 109 105  CO2 22 22 23  20* 26 22  GLUCOSE 105* 138* 130* 100* 112* 152*  BUN 31* 35* 37* 37* 37* 49*  CREATININE 1.51* 2.15* 1.73* 1.61* 1.80* 2.02*  CALCIUM 8.6* 7.7* 8.7* 8.4* 8.4* 8.4*  MG 2.6* 2.2 2.6* 2.5* 2.4 2.7*  PHOS 2.4*  --  2.5  --   --   --     CBC: Recent Labs  Lab 03/14/23 0311  03/15/23 0232 03/16/23 0419  WBC 14.3* 12.4* 17.5*  HGB 11.8* 9.6* 10.5*  HCT 37.6* 30.1* 32.3*  MCV 102.5* 102.4* 99.4  PLT 231 198 248    LFT No results for input(s): "AST", "ALT", "ALKPHOS", "BILITOT", "PROT", "ALBUMIN" in the last 168 hours.   Antibiotics: Anti-infectives (From admission, onward)    Start     Dose/Rate Route Frequency Ordered Stop   03/18/23 2200  Ampicillin-Sulbactam (UNASYN) 3 g in sodium chloride 0.9 % 100 mL IVPB        3 g 200 mL/hr over 30 Minutes Intravenous Every 12 hours 03/18/23 1126     03/17/23 1400  Ampicillin-Sulbactam (UNASYN) 3 g in sodium chloride 0.9 % 100 mL IVPB  Status:  Discontinued        3 g 200 mL/hr over 30 Minutes Intravenous Every 6 hours 03/17/23 1210 03/18/23 1126   03/15/23 1700  Ampicillin-Sulbactam (UNASYN) 3 g in sodium chloride 0.9 % 100 mL IVPB  Status:  Discontinued        3  g 200 mL/hr over 30 Minutes Intravenous Every 12 hours 03/15/23 1150 03/17/23 1210   03/14/23 1200  Ampicillin-Sulbactam (UNASYN) 3 g in sodium chloride 0.9 % 100 mL IVPB  Status:  Discontinued        3 g 200 mL/hr over 30 Minutes Intravenous Every 6 hours 03/14/23 1034 03/15/23 1150   03/14/23 0730  ceFAZolin (ANCEF) IVPB 2g/100 mL premix        2 g 200 mL/hr over 30 Minutes Intravenous On call to O.R. 03/14/23 1610 03/14/23 0807   03/14/23 0643  ceFAZolin (ANCEF) 2-4 GM/100ML-% IVPB       Note to Pharmacy: Myrlene Broker M: cabinet override      03/14/23 0643 03/14/23 0811   03/13/23 0000  azithromycin (ZITHROMAX) 500 mg in sodium chloride 0.9 % 250 mL IVPB        500 mg 250 mL/hr over 60 Minutes Intravenous Every 24 hours 03/12/23 0543 03/16/23 2251   03/12/23 1000  cefTRIAXone (ROCEPHIN) 2 g in sodium chloride 0.9 % 100 mL IVPB  Status:  Discontinued        2 g 200 mL/hr over 30 Minutes Intravenous Every 24 hours 03/12/23 0543 03/14/23 1030   03/11/23 2345  cefTRIAXone (ROCEPHIN) 1 g in sodium chloride 0.9 % 100 mL IVPB        1 g 200 mL/hr over  30 Minutes Intravenous  Once 03/11/23 2344 03/12/23 0034   03/11/23 2345  azithromycin (ZITHROMAX) 500 mg in sodium chloride 0.9 % 250 mL IVPB        500 mg 250 mL/hr over 60 Minutes Intravenous  Once 03/11/23 2344 03/12/23 0156   03/11/23 2245  ceFAZolin (ANCEF) IVPB 2g/100 mL premix        2 g 200 mL/hr over 30 Minutes Intravenous  Once 03/11/23 2242 03/11/23 2244        DVT prophylaxis: ***  Code Status: ***  Family Communication: ***   CONSULTS ***   Subjective   ***   Objective    Physical Examination:   General:  *** Cardiovascular: *** Respiratory: *** Abdomen: *** Extremities: ***  Neurologic:  ***   Status is: Inpatient:  ***           Jonathan Mathews S Jonathan Mathews   [facility] If 7PM-7AM, please contact night-coverage at [email or web address], Office  [phone]   03/20/2023, 11:12 AM  LOS: 8 days

## 2023-03-20 NOTE — TOC Transition Note (Addendum)
Transition of Care [facility]) - Discharge Note   Patient Details  Name: Jonathan Mathews MRN: [id number] Date of Birth: 1933-11-28  [facility] CM/SW Contact:  Maryjean Ka, LCSW Phone Number: 03/20/2023, 3:46 PM   Clinical Narrative:    [facility] will [facility] to [facility] [place]; phone number for report [phone]. This CSW communicated with [facility] [place] [phone] who informed that discharge summary has been obtained through the hub. This CSW called and coordinated transport with [facility], and completed Med necessity form and facesheet in discharge packet. CSW notified provider that DNR needs signed and CSW observed DNR being signed by provider.  This CSW spoke with patient's daughter Jonathan Mathews at bedside and family is in agreement with discharge plan. No identified barriers or needs at discharge.   Final next level of care: [facility] [place] Report [phone]) Barriers to Discharge: No Barriers Identified   Patient Goals and CMS Choice Patient states their goals for this hospitalization and ongoing recovery are:: For end of life care with [facility]. CMS Medicare.gov Compare Post Acute Care list provided to:: Other (Comment Required) Jonathan Mathews, Jonathan Mathews Spouse [phone]  [phone]  Jonathan Mathews,Jonathan Mathews Daughter   [phone]) Choice offered to / list presented to : Patient, Spouse, Adult Children Jonathan Mathews, Jonathan Mathews Spouse [phone]  [phone]  Jonathan Mathews,Jonathan Mathews Daughter   [phone]) [place] ownership interest in [facility].provided to:: Spouse    Discharge Placement                Patient to be transferred to facility by: [facility] Name of family member notified: Jonathan Mathews, Jonathan Mathews [phone]  [phone]  Jonathan Mathews,Jonathan Mathews Daughter   [phone] Patient and family notified of of transfer: 03/20/23  Discharge Plan and Services Additional resources added to the After Visit Summary for                                        Social Drivers of Health (SDOH) Interventions SDOH Screenings   Food Insecurity: No Food Insecurity (03/12/2023)  Housing: Low Risk  (03/12/2023)  Transportation Needs: No Transportation Needs (03/12/2023)  Utilities: Not At Risk (03/12/2023)  Social Connections: Socially Integrated (03/12/2023)  Tobacco Use: Medium Risk (03/11/2023)     Readmission Risk Interventions     No data to display

## 2023-03-20 NOTE — Discharge Summary (Addendum)
Physician Discharge Summary   Patient: Jonathan Mathews MRN: [id number] DOB: 15-Apr-1933  Admit date:     03/11/2023  Discharge date: 03/20/23  Discharge Physician: Jonathan Mathews   PCP: Jonathan Care, DO   Recommendations at discharge:   Patient will discharge to residential hospice  Discharge Diagnoses: Principal Problem:   Closed displaced fracture of right femoral neck (HCC) Active Problems:   Acute respiratory failure with hypoxia (HCC)   Community acquired pneumonia   AKI (acute kidney injury) (HCC)   Chronic kidney disease, stage 3 (HCC)   Moderate COPD (chronic obstructive pulmonary disease) (HCC)   Panlobular emphysema (HCC)   Paroxysmal atrial fibrillation (HCC)   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 *  Hospital Course:  Brief Narrative:   89 y.o. male with medical history significant of PAF, HFrEF (EF 30-35%), CAD, HTN, TIA, on pradaxa.  Patient presented with fall and right hip pain and was admitted with closed displaced fracture of the right femoral neck and is also noted to have community-acquired pneumonia along with AKI on CKD 3.  P orthopedic requested patient to be transferred to [facility] long for surgery.  Cardiology team was consulted for clearance. Patient underwent arthroplasty of the right hip on 1/19, postop course was complicated by hypoxic respiratory failure combination of aspiration/fluid overload requiring Lasix.  This further caused acute kidney injury.     Assessment & Plan:  Acute hypoxemic respiratory failure -Secondary to community-acquired pneumonia, systolic heart failure -Family opted for comfort measures  Right hip fracture -S/p hip arthroplasty on 1/19 -Patient was transferred from [facility] for surgical management per orthopedic recommendations  Goals of Mathews 89 year old male with medical medical comorbidities presents with acute  hypoxemic respiratory failure requiring high amount of oxygen.  Goals of Mathews obtained with palliative Mathews.  Patient is DNR.  Family opted for comfort measures.  Plan to transfer to residential hospice for end-of-life Mathews.          Consultants: Palliative Mathews Procedures performed:   Disposition: Hospice Mathews Diet recommendation:  Discharge Diet Orders (From admission, onward)     Start     Ordered   03/20/23 0000  Diet - low sodium heart healthy        03/20/23 1359           Regular diet DISCHARGE MEDICATION: Allergies as of 03/20/2023       Reactions   Clindamycin/lincomycin Diarrhea   Doxycycline Nausea And Vomiting   Gi upset   Pravastatin    Urinary retention   Zocor [simvastatin]    myalgia   Ivp Dye [iodinated Contrast Media] Rash        Medication List     STOP taking these medications    albuterol (2.5 MG/3ML) 0.083% nebulizer solution Commonly known as: PROVENTIL   albuterol 108 (90 Base) MCG/ACT inhaler Commonly known as: VENTOLIN HFA   ALPRAZolam 0.5 MG tablet Commonly known as: XANAX   amiodarone 400 MG tablet Commonly known as: PACERONE   aspirin 81 MG chewable tablet   azelastine 0.1 % nasal spray Commonly known as: ASTELIN   carvedilol 6.25 MG tablet Commonly known as: COREG   cetirizine 10 MG tablet Commonly known as: ZYRTEC   clopidogrel 75 MG tablet Commonly known as: PLAVIX   fish oil-omega-3 fatty acids 1000 MG capsule   fluticasone 50 MCG/ACT nasal spray Commonly known as: FLONASE  furosemide 20 MG tablet Commonly known as: LASIX   irbesartan 150 MG tablet Commonly known as: AVAPRO   ketoconazole 2 % cream Commonly known as: NIZORAL   montelukast 10 MG tablet Commonly known as: SINGULAIR   omeprazole 40 MG capsule Commonly known as: PRILOSEC   polyethylene glycol powder 17 GM/SCOOP powder Commonly known as: GLYCOLAX/MIRALAX   rosuvastatin 10 MG tablet Commonly known as: CRESTOR   spironolactone  25 MG tablet Commonly known as: ALDACTONE   tamsulosin 0.4 MG Caps capsule Commonly known as: FLOMAX   Trelegy Ellipta 100-62.5-25 MCG/ACT Aepb Generic drug: Fluticasone-Umeclidin-Vilant       TAKE these medications    acetaminophen 160 MG/5ML solution Commonly known as: TYLENOL Take 15.6 mLs (500 mg total) by mouth every 6 (six) hours as needed for moderate pain (pain score 4-6).        Discharge Exam: Filed Weights   03/11/23 2158  Weight: 77.1 kg   Appears in no acute distress S1-S2, regular Lungs clear to auscultation bilaterally  Condition at discharge: good  The results of significant diagnostics from this hospitalization (including imaging, microbiology, ancillary and laboratory) are listed below for reference.   Imaging Studies: ECHOCARDIOGRAM COMPLETE Result Date: 03/19/2023    ECHOCARDIOGRAM REPORT   Patient Name:   Jonathan Mathews [facility] Date of Exam: 03/19/2023 Medical Rec #:  [id number]       Height:       68.0 in Accession #:    [id number]      Weight:       170.0 lb Date of Birth:  Feb 22, 1934       BSA:          1.907 m Patient Age:    89 years        BP:           109/50 mmHg Patient Gender: M               HR:           65 bpm. Exam Location:  Inpatient Procedure: 2D Echo, Cardiac Doppler and Color Doppler Indications:    CHF I50.21  History:        Patient has no prior history of Echocardiogram examinations.                 COPD and CKD, Arrythmias:Atrial Fibrillation; Risk                 Factors:Former Smoker and Hypertension.  Sonographer:    Jonathan Mathews RVT RCS Referring Phys: [id number] Jonathan Mathews IMPRESSIONS  1. Left ventricular ejection fraction, by estimation, is 35 to 40%. Left ventricular ejection fraction by PLAX is 32 %. The left ventricle has moderately decreased function. The left ventricle demonstrates regional wall motion abnormalities (see scoring  diagram/findings for description). There is mild concentric left ventricular hypertrophy. Left ventricular  diastolic parameters are consistent with Grade I diastolic dysfunction (impaired relaxation).  2. Right ventricular systolic function is normal. The right ventricular size is normal. There is normal pulmonary artery systolic pressure.  3. The mitral valve is normal in structure. Trivial mitral valve regurgitation. No evidence of mitral stenosis.  4. The aortic valve is tricuspid. There is mild calcification of the aortic valve. Aortic valve regurgitation is mild. No aortic stenosis is present.  5. The inferior vena cava is normal in size with greater than 50% respiratory variability, suggesting right atrial pressure of 3 mmHg. FINDINGS  Left Ventricle: Left ventricular ejection fraction, by estimation, is  35 to 40%. Left ventricular ejection fraction by PLAX is 32 %. The left ventricle has moderately decreased function. The left ventricle demonstrates regional wall motion abnormalities. The left ventricular internal cavity size was normal in size. There is mild concentric left ventricular hypertrophy. Left ventricular diastolic parameters are consistent with Grade I diastolic dysfunction (impaired relaxation). Normal left  ventricular filling pressure.  LV Wall Scoring: The entire inferior wall, basal anterolateral segment, mid inferoseptal segment, basal inferoseptal segment, and apex are hypokinetic. The entire anterior wall, mid and distal lateral wall, entire anterior septum, posterior wall, and mid anterolateral segment are normal. Right Ventricle: The right ventricular size is normal. No increase in right ventricular wall thickness. Right ventricular systolic function is normal. There is normal pulmonary artery systolic pressure. The tricuspid regurgitant velocity is 2.60 m/s, and  with an assumed right atrial pressure of 3 mmHg, the estimated right ventricular systolic pressure is 30.0 mmHg. Left Atrium: Left atrial size was normal in size. Right Atrium: Right atrial size was normal in size. Pericardium:  There is no evidence of pericardial effusion. Mitral Valve: The mitral valve is normal in structure. Trivial mitral valve regurgitation. No evidence of mitral valve stenosis. Tricuspid Valve: The tricuspid valve is normal in structure. Tricuspid valve regurgitation is trivial. No evidence of tricuspid stenosis. Aortic Valve: The aortic valve is tricuspid. There is mild calcification of the aortic valve. Aortic valve regurgitation is mild. Aortic regurgitation PHT measures 713 msec. No aortic stenosis is present. Aortic valve mean gradient measures 2.0 mmHg. Aortic valve peak gradient measures 4.5 mmHg. Aortic valve area, by VTI measures 2.46 cm. Pulmonic Valve: The pulmonic valve was normal in structure. Pulmonic valve regurgitation is trivial. No evidence of pulmonic stenosis. Aorta: The aortic root is normal in size and structure. Venous: The inferior vena cava is normal in size with greater than 50% respiratory variability, suggesting right atrial pressure of 3 mmHg. IAS/Shunts: No atrial level shunt detected by color flow Doppler.  LEFT VENTRICLE PLAX 2D LV EF:         Left            Diastology                ventricular     LV e' medial:    3.21 cm/s                ejection        LV E/e' medial:  14.5                fraction by     LV e' lateral:   7.18 cm/s                PLAX is 32      LV E/e' lateral: 6.5                %. LVIDd:         5.20 cm LVIDs:         4.40 cm LV PW:         1.30 cm LV IVS:        1.10 cm LVOT diam:     2.20 cm LV SV:         62 LV SV Index:   33 LVOT Area:     3.80 cm  IVC IVC diam: 1.90 cm LEFT ATRIUM             Index        RIGHT  ATRIUM          Index LA diam:        4.30 cm 2.25 cm/m   RA Area:     7.60 cm LA Vol (A2C):   50.5 ml 26.48 ml/m  RA Volume:   10.60 ml 5.56 ml/m LA Vol (A4C):   40.4 ml 21.16 ml/m LA Biplane Vol: 49.7 ml 26.06 ml/m  AORTIC VALVE                    PULMONIC VALVE AV Area (Vmax):    2.47 cm     PV Vmax:          1.15 m/s AV Area (Vmean):    2.48 cm     PV Peak grad:     5.3 mmHg AV Area (VTI):     2.46 cm     PR End Diast Vel: 1.62 msec AV Vmax:           106.00 cm/s AV Vmean:          72.100 cm/s AV VTI:            0.253 m AV Peak Grad:      4.5 mmHg AV Mean Grad:      2.0 mmHg LVOT Vmax:         69.00 cm/s LVOT Vmean:        47.100 cm/s LVOT VTI:          0.164 m LVOT/AV VTI ratio: 0.65 AI PHT:            713 msec AR Vena Contracta: 0.40 cm  AORTA Ao Root diam: 3.50 cm Ao Asc diam:  3.60 cm MITRAL VALVE               TRICUSPID VALVE MV Area (PHT): 2.80 cm    TR Peak grad:   27.0 mmHg MV Decel Time: 271 msec    TR Vmax:        260.00 cm/s MV E velocity: 46.70 cm/s MV A velocity: 90.80 cm/s  SHUNTS MV E/A ratio:  0.51        Systemic VTI:  0.16 m                            Systemic Diam: 2.20 cm Chilton Si MD Electronically signed by Chilton Si MD Signature Date/Time: 03/19/2023/5:51:53 PM    Final    DG CHEST PORT 1 VIEW Result Date: 03/17/2023 CLINICAL DATA:  89 year old male with increase shortness of breath. EXAM: PORTABLE CHEST 1 VIEW COMPARISON:  Portable chest yesterday. FINDINGS: Portable AP semi upright view at 0546 hours. Lower lung volumes with increasing coarse and confluent bilateral lung opacity. Upper lobe primarily affected on the right. Multilobar involvement on the left. Generalized progression since yesterday. Grossly stable mediastinal contours. Visualized tracheal air column is within normal limits. No pneumothorax. No obvious pleural effusion. Paucity of bowel gas. Stable visualized osseous structures. IMPRESSION: Lower lung volumes and progressed bilateral pulmonary coarse and interstitial opacity in the same distribution as on recent exams. Unclear if this is progressive infection or recurrent asymmetric pulmonary edema. Electronically Signed   By: Odessa Fleming M.D.   On: 03/17/2023 06:01   DG Chest Port 1 View Result Date: 03/16/2023 CLINICAL DATA:  89 year old male with shortness of breath. EXAM: PORTABLE CHEST  1 VIEW COMPARISON:  Portable chest 03/14/2023 and earlier. FINDINGS: Portable AP semi upright view at 0817 hours.  Regressed but not resolved bilateral pulmonary interstitial opacity with residual now most pronounced in the right upper lobe. Improved lung volumes. Stable cardiac size and mediastinal contours. Visualized tracheal air column is within normal limits. No pneumothorax, pleural effusion, or areas of worsening ventilation. Stable visualized osseous structures.  Paucity of visible bowel gas. IMPRESSION: 1. Regressed bilateral pulmonary interstitial opacity with residual most pronounced in the right upper lobe. 2. Improved lung volumes and ventilation. No new cardiopulmonary abnormality. Electronically Signed   By: Odessa Fleming M.D.   On: 03/16/2023 09:09   DG Chest Port 1 View Result Date: 03/14/2023 CLINICAL DATA:  Shortness of breath EXAM: PORTABLE CHEST 1 VIEW COMPARISON:  03/11/2023 FINDINGS: Persistent pulmonary infiltrates which are diffuse, but with some improvement in the right lower lobe and right middle lobe since the study of 3 days ago. No worsening or new finding IMPRESSION: Persistent diffuse pulmonary infiltrates, but with some improvement in the right lower lobe and right middle lobe since the study of 3 days ago. Electronically Signed   By: Paulina Fusi M.D.   On: 03/14/2023 11:04   DG C-Arm 1-60 Min-No Report Result Date: 03/14/2023 CLINICAL DATA:  Fluoroscopy provided for right hip arthroplasty. EXAM: OPERATIVE RIGHT HIP (WITH PELVIS IF PERFORMED) 3 VIEWS TECHNIQUE: Fluoroscopic spot image(s) were submitted for interpretation post-operatively. Dose: 1.1 mGy. COMPARISON:  03/11/2023. FINDINGS: Submitted images show resection of the right femoral head and neck placement the femoral and acetabular prosthetic components. IMPRESSION: Fluoroscopy provided for right hip arthroplasty. Please refer to the procedure report for further details. Electronically Signed   By: Amie Portland M.D.   On:  03/14/2023 10:12   DG HIP UNILAT WITH PELVIS 1V RIGHT Result Date: 03/14/2023 CLINICAL DATA:  Fluoroscopy provided for right hip arthroplasty. EXAM: OPERATIVE RIGHT HIP (WITH PELVIS IF PERFORMED) 3 VIEWS TECHNIQUE: Fluoroscopic spot image(s) were submitted for interpretation post-operatively. Dose: 1.1 mGy. COMPARISON:  03/11/2023. FINDINGS: Submitted images show resection of the right femoral head and neck placement the femoral and acetabular prosthetic components. IMPRESSION: Fluoroscopy provided for right hip arthroplasty. Please refer to the procedure report for further details. Electronically Signed   By: Amie Portland M.D.   On: 03/14/2023 10:12   DG C-Arm 1-60 Min-No Report Result Date: 03/14/2023 CLINICAL DATA:  Fluoroscopy provided for right hip arthroplasty. EXAM: OPERATIVE RIGHT HIP (WITH PELVIS IF PERFORMED) 3 VIEWS TECHNIQUE: Fluoroscopic spot image(s) were submitted for interpretation post-operatively. Dose: 1.1 mGy. COMPARISON:  03/11/2023. FINDINGS: Submitted images show resection of the right femoral head and neck placement the femoral and acetabular prosthetic components. IMPRESSION: Fluoroscopy provided for right hip arthroplasty. Please refer to the procedure report for further details. Electronically Signed   By: Amie Portland M.D.   On: 03/14/2023 10:12   X-ray pelvis complete Result Date: 03/14/2023 CLINICAL DATA:  Postop right hip arthroplasty. EXAM: PELVIS - 1-2 VIEW COMPARISON:  03/11/2023. FINDINGS: New right hip total arthroplasty appears well seated and well aligned. No acute fracture or evidence of an operative complication. SI joints, pubic symphysis and left hip joint are normally spaced and aligned. Right sided soft tissue edema and air from the recent surgery. IMPRESSION: Well-positioned right hip total arthroplasty. Electronically Signed   By: Amie Portland M.D.   On: 03/14/2023 10:10   CT CHEST ABDOMEN PELVIS WO CONTRAST Result Date: 03/11/2023 CLINICAL DATA:   Polytrauma, blunt EXAM: CT CHEST, ABDOMEN AND PELVIS WITHOUT CONTRAST TECHNIQUE: Multidetector CT imaging of the chest, abdomen and pelvis was performed following the standard protocol  without IV contrast. RADIATION DOSE REDUCTION: This exam was performed according to the departmental dose-optimization program which includes automated exposure control, adjustment of the mA and/or kV according to patient size and/or use of iterative reconstruction technique. COMPARISON:  None Available. FINDINGS: CHEST: Cardiovascular: The thoracic aorta is normal in caliber. The heart is normal in size. No significant pericardial effusion. Four-vessel coronary calcification. Lungs/Pleura: Centrilobular and paraseptal mild-to-moderate emphysematous changes. Diffuse bronchial wall thickening. Dependent bilateral upper and lower lung zones patchy airspace opacities. No pulmonary nodule. No pulmonary mass. No pulmonary contusion or laceration. No pneumatocele formation. No pleural effusion. No pneumothorax. No hemothorax. Mediastinum/Nodes: No pneumomediastinum. The central airways are patent. The esophagus is unremarkable.  Small hiatal hernia. The thyroid is unremarkable. Limited evaluation for hilar lymphadenopathy on this noncontrast study. No mediastinal or axillary lymphadenopathy. Musculoskeletal/Chest wall No chest wall mass.  Bilateral gynecomastia. No acute rib or sternal fracture. No spinal fracture. ABDOMEN / PELVIS: Hepatobiliary: Not enlarged. No focal lesion. The gallbladder is otherwise unremarkable with no radio-opaque gallstones. No biliary ductal dilatation. Pancreas: Normal pancreatic contour. No main pancreatic duct dilatation. Spleen: Not enlarged. No focal lesion. Adrenals/Urinary Tract: No nodularity bilaterally. No hydroureteronephrosis. No nephroureterolithiasis. No contour deforming renal mass. The urinary bladder is unremarkable. Stomach/Bowel: No small or large bowel wall thickening or dilatation. Colonic  diverticulosis. The appendix is unremarkable. Vasculature/Lymphatic: Severe atherosclerotic plaque. No abdominal aorta or iliac aneurysm. No abdominal, pelvic, inguinal lymphadenopathy. Reproductive: Normal. Other: No simple free fluid ascites. No pneumoperitoneum. No mesenteric hematoma identified. No organized fluid collection. Musculoskeletal: No significant soft tissue hematoma. Acute displaced, comminuted, impacted right femoral neck fracture. No spinal fracture. Multilevel degenerative changes and intervertebral disc space vacuum phenomenon. Ports and Devices: None. IMPRESSION: 1. No acute intrathoracic, intra-abdominal, intrapelvic traumatic injury. 2. No acute fracture or traumatic malalignment of the thoracic or lumbar spine. 3. Acute displaced, comminuted, impacted right femoral neck fracture. 4. Diffuse bronchial wall thickening. Dependent bilateral upper and lower lung zones patchy airspace opacities. Findings could represent developing infection/inflammation (aspiration pneumonia). Recommend follow up CT in 3 months to evaluate for complete resolution. 5. Other imaging findings of potential clinical significance: Small hiatal hernia. Colonic diverticulosis with no acute diverticulitis. Aortic Atherosclerosis (ICD10-I70.0) including four-vessel coronary calcifications. 6. Emphysema (ICD10-J43.9) . Electronically Signed   By: Tish Frederickson M.D.   On: 03/11/2023 23:40   CT HIP RIGHT WO CONTRAST Result Date: 03/11/2023 CLINICAL DATA:  Hip trauma, fracture suspected, xray done EXAM: CT OF THE RIGHT HIP WITHOUT CONTRAST TECHNIQUE: Multidetector CT imaging of the right hip was performed according to the standard protocol. Multiplanar CT image reconstructions were also generated. RADIATION DOSE REDUCTION: This exam was performed according to the departmental dose-optimization program which includes automated exposure control, adjustment of the mA and/or kV according to patient size and/or use of iterative  reconstruction technique. COMPARISON:  CT abdomen pelvis 03/11/2023, x-ray right hip 03/11/2023 FINDINGS: Bones/Joint/Cartilage Acute displaced, comminuted, and impacted right femoral neck fracture. No dislocation. No acute fracture of the right pelvic bones. Ligaments Suboptimally assessed by CT. Muscles and Tendons Grossly unremarkable. Soft tissues Right hip subcutaneus soft tissue edema/hematoma formation. Other: Colonic diverticulosis.  Atherosclerotic plaque. IMPRESSION: 1. Acute displaced, comminuted, and impacted right femoral neck fracture. 2.  Aortic Atherosclerosis (ICD10-I70.0). Electronically Signed   By: Tish Frederickson M.D.   On: 03/11/2023 23:04   CT HEAD WO CONTRAST Result Date: 03/11/2023 CLINICAL DATA:  Head trauma, moderate-severe; Polytrauma, blunt. Fall EXAM: CT HEAD WITHOUT CONTRAST CT CERVICAL SPINE WITHOUT CONTRAST TECHNIQUE:  Multidetector CT imaging of the head and cervical spine was performed following the standard protocol without intravenous contrast. Multiplanar CT image reconstructions of the cervical spine were also generated. RADIATION DOSE REDUCTION: This exam was performed according to the departmental dose-optimization program which includes automated exposure control, adjustment of the mA and/or kV according to patient size and/or use of iterative reconstruction technique. COMPARISON:  None Available. FINDINGS: CT HEAD FINDINGS Brain: Cerebral ventricle sizes are concordant with the degree of cerebral volume loss. Patchy and confluent areas of decreased attenuation are noted throughout the deep and periventricular white matter of the cerebral hemispheres bilaterally, compatible with chronic microvascular ischemic disease. No evidence of large-territorial acute infarction. No parenchymal hemorrhage. No mass lesion. No extra-axial collection. No mass effect or midline shift. No hydrocephalus. Basilar cisterns are patent. Vascular: No hyperdense vessel. Atherosclerotic  calcifications are present within the cavernous internal carotid and vertebral arteries. Skull: No acute fracture or focal lesion. Sinuses/Orbits: Left frontal, bilateral ethmoid, bilateral maxillary sinus mucosal thickening. Otherwise paranasal sinuses and mastoid air cells are clear. Right lens replacement. Otherwise the orbits are unremarkable. Other: None. CT CERVICAL SPINE FINDINGS Alignment: Normal. Skull base and vertebrae: Multilevel moderate degenerative changes of the spine. No acute fracture. No aggressive appearing focal osseous lesion or focal pathologic process. Soft tissues and spinal canal: No prevertebral fluid or swelling. No visible canal hematoma. Upper chest: Biapical patchy airspace opacities. Emphysematous changes. Other: Atherosclerotic plaque of the aortic arch and its branches. IMPRESSION: 1. No acute intracranial abnormality. 2. No acute displaced fracture or traumatic listhesis of the cervical spine. 3. Sinus disease. 4. Biapical patchy airspace opacities. Please see separately dictated CT chest 03/11/2023. 5. Aortic Atherosclerosis (ICD10-I70.0) and Emphysema (ICD10-J43.9). Electronically Signed   By: Tish Frederickson M.D.   On: 03/11/2023 23:02   CT CERVICAL SPINE WO CONTRAST Result Date: 03/11/2023 CLINICAL DATA:  Head trauma, moderate-severe; Polytrauma, blunt. Fall EXAM: CT HEAD WITHOUT CONTRAST CT CERVICAL SPINE WITHOUT CONTRAST TECHNIQUE: Multidetector CT imaging of the head and cervical spine was performed following the standard protocol without intravenous contrast. Multiplanar CT image reconstructions of the cervical spine were also generated. RADIATION DOSE REDUCTION: This exam was performed according to the departmental dose-optimization program which includes automated exposure control, adjustment of the mA and/or kV according to patient size and/or use of iterative reconstruction technique. COMPARISON:  None Available. FINDINGS: CT HEAD FINDINGS Brain: Cerebral ventricle  sizes are concordant with the degree of cerebral volume loss. Patchy and confluent areas of decreased attenuation are noted throughout the deep and periventricular white matter of the cerebral hemispheres bilaterally, compatible with chronic microvascular ischemic disease. No evidence of large-territorial acute infarction. No parenchymal hemorrhage. No mass lesion. No extra-axial collection. No mass effect or midline shift. No hydrocephalus. Basilar cisterns are patent. Vascular: No hyperdense vessel. Atherosclerotic calcifications are present within the cavernous internal carotid and vertebral arteries. Skull: No acute fracture or focal lesion. Sinuses/Orbits: Left frontal, bilateral ethmoid, bilateral maxillary sinus mucosal thickening. Otherwise paranasal sinuses and mastoid air cells are clear. Right lens replacement. Otherwise the orbits are unremarkable. Other: None. CT CERVICAL SPINE FINDINGS Alignment: Normal. Skull base and vertebrae: Multilevel moderate degenerative changes of the spine. No acute fracture. No aggressive appearing focal osseous lesion or focal pathologic process. Soft tissues and spinal canal: No prevertebral fluid or swelling. No visible canal hematoma. Upper chest: Biapical patchy airspace opacities. Emphysematous changes. Other: Atherosclerotic plaque of the aortic arch and its branches. IMPRESSION: 1. No acute intracranial abnormality. 2. No acute displaced  fracture or traumatic listhesis of the cervical spine. 3. Sinus disease. 4. Biapical patchy airspace opacities. Please see separately dictated CT chest 03/11/2023. 5. Aortic Atherosclerosis (ICD10-I70.0) and Emphysema (ICD10-J43.9). Electronically Signed   By: Tish Frederickson M.D.   On: 03/11/2023 23:02   DG Chest Port 1 View Result Date: 03/11/2023 CLINICAL DATA:  Trauma. EXAM: PORTABLE CHEST 1 VIEW COMPARISON:  Radiograph 02/16/2023 FINDINGS: Lung volumes are low. Stable heart size and mediastinal contours. There are diffuse  interstitial opacities are new from prior exam, suspicious for pulmonary edema. Possible left pleural effusion. No convincing pneumothorax skull multiple moderate overlying artifacts. On limited assessment, no displaced rib fracture. IMPRESSION: Low lung volumes. Diffuse interstitial opacities suspicious for pulmonary edema. Possible small left pleural effusion. Electronically Signed   By: Narda Rutherford M.D.   On: 03/11/2023 22:14   DG Pelvis Portable Result Date: 03/11/2023 CLINICAL DATA:  Blunt trauma, pain. EXAM: RIGHT FEMUR PORTABLE 1 VIEW; PORTABLE PELVIS 1-2 VIEWS COMPARISON:  None Available. FINDINGS: Pelvis: Displaced right femoral neck fracture. Proximal migration of the femoral shaft. No additional pelvic fracture. Pubic rami are intact. Pubic symphysis and sacroiliac joints are congruent. Femur: Frontal views of the right femur obtained. Displaced femoral neck fracture with proximal migration of the femoral shaft. The distal femur is intact. Distal most femur including the knee not included in the field of view. IMPRESSION: Displaced right femoral neck fracture. Electronically Signed   By: Narda Rutherford M.D.   On: 03/11/2023 22:13   DG FEMUR PORT, 1V RIGHT Result Date: 03/11/2023 CLINICAL DATA:  Blunt trauma, pain. EXAM: RIGHT FEMUR PORTABLE 1 VIEW; PORTABLE PELVIS 1-2 VIEWS COMPARISON:  None Available. FINDINGS: Pelvis: Displaced right femoral neck fracture. Proximal migration of the femoral shaft. No additional pelvic fracture. Pubic rami are intact. Pubic symphysis and sacroiliac joints are congruent. Femur: Frontal views of the right femur obtained. Displaced femoral neck fracture with proximal migration of the femoral shaft. The distal femur is intact. Distal most femur including the knee not included in the field of view. IMPRESSION: Displaced right femoral neck fracture. Electronically Signed   By: Narda Rutherford M.D.   On: 03/11/2023 22:13    Microbiology: Results for orders  placed or performed during the hospital encounter of 03/11/23  Resp panel by RT-PCR (RSV, Flu A&B, Covid) Urine, Clean Catch     Status: None   Collection Time: 03/12/23  3:45 AM   Specimen: Urine, Clean Catch; Nasal Swab  Result Value Ref Range Status   SARS Coronavirus 2 by RT PCR NEGATIVE NEGATIVE Final   Influenza A by PCR NEGATIVE NEGATIVE Final   Influenza B by PCR NEGATIVE NEGATIVE Final    Comment: (NOTE) The Xpert Xpress SARS-CoV-2/FLU/RSV plus assay is intended as an aid in the diagnosis of influenza from Nasopharyngeal swab specimens and should not be used as a sole basis for treatment. Nasal washings and aspirates are unacceptable for Xpert Xpress SARS-CoV-2/FLU/RSV testing.  Fact Sheet for Patients: BloggerCourse.com  Fact Sheet for Healthcare Providers: SeriousBroker.it  This test is not yet approved or cleared by the [place] FDA and has been authorized for detection and/or diagnosis of SARS-CoV-2 by FDA under an Emergency Use Authorization (EUA). This EUA will remain in effect (meaning this test can be used) for the duration of the COVID-19 declaration under Section 564(b)(1) of the Act, 21 U.S.C. section 360bbb-3(b)(1), unless the authorization is terminated or revoked.     Resp Syncytial Virus by PCR NEGATIVE NEGATIVE Final    Comment: (  NOTE) Fact Sheet for Patients: BloggerCourse.com  Fact Sheet for Healthcare Providers: SeriousBroker.it  This test is not yet approved or cleared by the [place] FDA and has been authorized for detection and/or diagnosis of SARS-CoV-2 by FDA under an Emergency Use Authorization (EUA). This EUA will remain in effect (meaning this test can be used) for the duration of the COVID-19 declaration under Section 564(b)(1) of the Act, 21 U.S.C. section 360bbb-3(b)(1), unless the authorization is terminated  or revoked.  Performed at [facility], [address] [place], [place] [address]   Respiratory (~20 pathogens) panel by PCR     Status: None   Collection Time: 03/12/23  6:24 AM   Specimen: Nasopharyngeal Swab; Respiratory  Result Value Ref Range Status   Adenovirus NOT DETECTED NOT DETECTED Final   Coronavirus 229E NOT DETECTED NOT DETECTED Final    Comment: (NOTE) The Coronavirus on the Respiratory Panel, DOES NOT test for the novel  Coronavirus (2019 nCoV)    Coronavirus HKU1 NOT DETECTED NOT DETECTED Final   Coronavirus NL63 NOT DETECTED NOT DETECTED Final   Coronavirus OC43 NOT DETECTED NOT DETECTED Final   Metapneumovirus NOT DETECTED NOT DETECTED Final   Rhinovirus / Enterovirus NOT DETECTED NOT DETECTED Final   Influenza A NOT DETECTED NOT DETECTED Final   Influenza B NOT DETECTED NOT DETECTED Final   Parainfluenza Virus 1 NOT DETECTED NOT DETECTED Final   Parainfluenza Virus 2 NOT DETECTED NOT DETECTED Final   Parainfluenza Virus 3 NOT DETECTED NOT DETECTED Final   Parainfluenza Virus 4 NOT DETECTED NOT DETECTED Final   Respiratory Syncytial Virus NOT DETECTED NOT DETECTED Final   Bordetella pertussis NOT DETECTED NOT DETECTED Final   Bordetella Parapertussis NOT DETECTED NOT DETECTED Final   Chlamydophila pneumoniae NOT DETECTED NOT DETECTED Final   Mycoplasma pneumoniae NOT DETECTED NOT DETECTED Final    Comment: Performed at [facility], [address] [place], [place] [address]  SARS Coronavirus 2 by RT PCR (hospital order, performed in [facility] hospital lab) *cepheid single result test* Nasopharyngeal Swab     Status: None   Collection Time: 03/12/23  6:24 AM   Specimen: Nasopharyngeal Swab; Nasal Swab  Result Value Ref Range Status   SARS Coronavirus 2 by RT PCR NEGATIVE NEGATIVE Final    Comment: Performed at [facility], [address] [place], [place] [address]  Surgical PCR screen     Status: None   Collection Time: 03/13/23  3:22  AM   Specimen: Nasal Mucosa; Nasal Swab  Result Value Ref Range Status   MRSA, PCR NEGATIVE NEGATIVE Final   Staphylococcus aureus NEGATIVE NEGATIVE Final    Comment: (NOTE) The Xpert SA Assay (FDA approved for NASAL specimens in patients 44 years of age and older), is one component of a comprehensive surveillance program. It is not intended to diagnose infection nor to guide or monitor treatment. Performed at [facility], [address], [place], [place] [address]     Labs: CBC: Recent Labs  Lab 03/14/23 0311 03/15/23 0232 03/16/23 0419  WBC 14.3* 12.4* 17.5*  HGB 11.8* 9.6* 10.5*  HCT 37.6* 30.1* 32.3*  MCV 102.5* 102.4* 99.4  PLT 231 198 248   Basic Metabolic Panel: Recent Labs  Lab 03/14/23 0311 03/15/23 0232 03/16/23 0419 03/17/23 0415 03/18/23 0403 03/19/23 0403  NA 139 139 139 140 144 142  K 3.7 4.0 4.0 3.8 3.7 3.6  CL 109 109 108 109 109 105  CO2 22 22 23  20* 26 22  GLUCOSE 105* 138* 130* 100* 112* 152*  BUN 31* 35* 37* 37* 37* 49*  CREATININE 1.51* 2.15* 1.73* 1.61* 1.80* 2.02*  CALCIUM 8.6* 7.7* 8.7* 8.4* 8.4* 8.4*  MG 2.6* 2.2 2.6* 2.5* 2.4 2.7*  PHOS 2.4*  --  2.5  --   --   --    Liver Function Tests: No results for input(s): "AST", "ALT", "ALKPHOS", "BILITOT", "PROT", "ALBUMIN" in the last 168 hours. CBG: Recent Labs  Lab 03/14/23 1155  GLUCAP 101*    Discharge time spent: greater than 30 minutes.  Signed: Meredeth Ide, MD [facility] 03/20/2023

## 2023-03-20 NOTE — Progress Notes (Signed)
Patient has been transported via Doctor, general practice by [facility] to [facility] [place]. Family at bedside and aware. PIV remains intact per request from hospice RN/Michelle. Verbal report was given via telephone earlier in shift.

## 2023-03-20 NOTE — Plan of Care (Signed)
  Problem: Clinical Measurements: Goal: Cardiovascular complication will be avoided Outcome: Adequate for Discharge   Problem: Elimination: Goal: Will not experience complications related to bowel motility Outcome: Adequate for Discharge   Problem: Elimination: Goal: Will not experience complications related to urinary retention Outcome: Adequate for Discharge   Problem: Safety: Goal: Ability to remain free from injury will improve Outcome: Adequate for Discharge   Problem: Skin Integrity: Goal: Risk for impaired skin integrity will decrease Outcome: Adequate for Discharge

## 2023-03-20 NOTE — Progress Notes (Signed)
Family is here bedside. Wife and daughter state they have not heard from [facility] in [place]. Writer contacted [facility] via answering service. Requested a return phone call.

## 2023-03-20 NOTE — Progress Notes (Signed)
Patient continues to call out for water, when given to him, his oxygen level drops to the low 80's and it takes him a while to recover with thee 15 liters of high flow, currently he is at 90%, RR are 24.

## 2023-03-27 DEATH — deceased
